# Patient Record
Sex: Female | Born: 1965
Health system: Southern US, Community
[De-identification: ages and names within clinical notes are randomized; demographics above are authoritative.]

## PROBLEM LIST (undated history)

## (undated) DIAGNOSIS — K219 Gastro-esophageal reflux disease without esophagitis: Secondary | ICD-10-CM

## (undated) DIAGNOSIS — I10 Essential (primary) hypertension: Secondary | ICD-10-CM

## (undated) DIAGNOSIS — Z973 Presence of spectacles and contact lenses: Secondary | ICD-10-CM

## (undated) HISTORY — PX: CHOLECYSTECTOMY: SHX55

---

## 1997-05-12 ENCOUNTER — Inpatient Hospital Stay (HOSPITAL_COMMUNITY): Admission: AD | Admit: 1997-05-12 | Discharge: 1997-05-15 | Payer: Self-pay | Admitting: *Deleted

## 1997-06-27 ENCOUNTER — Other Ambulatory Visit: Admission: RE | Admit: 1997-06-27 | Discharge: 1997-06-27 | Payer: Self-pay | Admitting: Obstetrics & Gynecology

## 2004-02-12 ENCOUNTER — Ambulatory Visit: Payer: Self-pay | Admitting: Otolaryngology

## 2005-10-02 ENCOUNTER — Ambulatory Visit: Payer: Self-pay | Admitting: Internal Medicine

## 2006-10-12 ENCOUNTER — Ambulatory Visit: Payer: Self-pay | Admitting: Obstetrics and Gynecology

## 2007-11-25 ENCOUNTER — Ambulatory Visit: Payer: Self-pay | Admitting: Obstetrics and Gynecology

## 2010-08-16 DIAGNOSIS — F17200 Nicotine dependence, unspecified, uncomplicated: Secondary | ICD-10-CM | POA: Insufficient documentation

## 2010-12-16 ENCOUNTER — Ambulatory Visit: Payer: Self-pay | Admitting: Internal Medicine

## 2012-08-03 ENCOUNTER — Ambulatory Visit: Payer: Self-pay | Admitting: Internal Medicine

## 2012-08-25 ENCOUNTER — Ambulatory Visit: Payer: Self-pay | Admitting: Obstetrics and Gynecology

## 2014-08-07 ENCOUNTER — Other Ambulatory Visit: Payer: Self-pay | Admitting: Internal Medicine

## 2014-08-07 DIAGNOSIS — R1310 Dysphagia, unspecified: Secondary | ICD-10-CM

## 2014-08-11 ENCOUNTER — Ambulatory Visit: Payer: Self-pay

## 2014-08-16 ENCOUNTER — Other Ambulatory Visit: Payer: Self-pay | Admitting: Internal Medicine

## 2014-08-16 DIAGNOSIS — S86911A Strain of unspecified muscle(s) and tendon(s) at lower leg level, right leg, initial encounter: Secondary | ICD-10-CM | POA: Insufficient documentation

## 2014-08-16 DIAGNOSIS — Z1231 Encounter for screening mammogram for malignant neoplasm of breast: Secondary | ICD-10-CM

## 2014-08-17 ENCOUNTER — Ambulatory Visit
Admission: RE | Admit: 2014-08-17 | Discharge: 2014-08-17 | Disposition: A | Discharge status: Home / Self Care | Payer: BLUE CROSS/BLUE SHIELD | Source: Ambulatory Visit | Attending: Internal Medicine | Admitting: Internal Medicine

## 2014-08-17 DIAGNOSIS — R1310 Dysphagia, unspecified: Secondary | ICD-10-CM | POA: Diagnosis not present

## 2014-08-17 DIAGNOSIS — K219 Gastro-esophageal reflux disease without esophagitis: Secondary | ICD-10-CM | POA: Insufficient documentation

## 2014-08-17 DIAGNOSIS — K449 Diaphragmatic hernia without obstruction or gangrene: Secondary | ICD-10-CM | POA: Diagnosis not present

## 2014-08-21 ENCOUNTER — Ambulatory Visit
Admission: RE | Admit: 2014-08-21 | Discharge: 2014-08-21 | Disposition: A | Discharge status: Home / Self Care | Payer: BLUE CROSS/BLUE SHIELD | Source: Ambulatory Visit | Attending: Internal Medicine | Admitting: Internal Medicine

## 2014-08-21 DIAGNOSIS — Z1231 Encounter for screening mammogram for malignant neoplasm of breast: Secondary | ICD-10-CM | POA: Diagnosis present

## 2014-08-23 ENCOUNTER — Emergency Department
Admission: EM | Admit: 2014-08-23 | Discharge: 2014-08-23 | Disposition: A | Discharge status: Home / Self Care | Payer: BLUE CROSS/BLUE SHIELD | Attending: Emergency Medicine | Admitting: Emergency Medicine

## 2014-08-23 ENCOUNTER — Other Ambulatory Visit: Payer: Self-pay

## 2014-08-23 ENCOUNTER — Encounter: Payer: Self-pay | Admitting: *Deleted

## 2014-08-23 ENCOUNTER — Emergency Department: Payer: BLUE CROSS/BLUE SHIELD

## 2014-08-23 DIAGNOSIS — I10 Essential (primary) hypertension: Secondary | ICD-10-CM | POA: Diagnosis not present

## 2014-08-23 DIAGNOSIS — R0789 Other chest pain: Secondary | ICD-10-CM | POA: Diagnosis present

## 2014-08-23 DIAGNOSIS — Z720 Tobacco use: Secondary | ICD-10-CM | POA: Diagnosis not present

## 2014-08-23 DIAGNOSIS — R079 Chest pain, unspecified: Secondary | ICD-10-CM

## 2014-08-23 HISTORY — DX: Essential (primary) hypertension: I10

## 2014-08-23 LAB — BASIC METABOLIC PANEL
Anion gap: 9 (ref 5–15)
BUN: 17 mg/dL (ref 6–20)
CALCIUM: 9.6 mg/dL (ref 8.9–10.3)
CHLORIDE: 105 mmol/L (ref 101–111)
CO2: 23 mmol/L (ref 22–32)
Creatinine, Ser: 0.79 mg/dL (ref 0.44–1.00)
GFR calc non Af Amer: >60 mL/min (ref >60)
Glucose, Bld: 128 mg/dL — ABNORMAL HIGH (ref 65–99)
Potassium: 4.1 mmol/L (ref 3.5–5.1)
SODIUM: 137 mmol/L (ref 135–145)

## 2014-08-23 LAB — CBC
HCT: 45.7 % (ref 35.0–47.0)
Hemoglobin: 15.3 g/dL (ref 12.0–16.0)
MCH: 30.7 pg (ref 26.0–34.0)
MCHC: 33.4 g/dL (ref 32.0–36.0)
MCV: 92 fL (ref 80.0–100.0)
Platelets: 165 10*3/uL (ref 150–440)
RBC: 4.96 MIL/uL (ref 3.80–5.20)
RDW: 15 % — AB (ref 11.5–14.5)
WBC: 12.5 10*3/uL — ABNORMAL HIGH (ref 3.6–11.0)

## 2014-08-23 LAB — TROPONIN I

## 2014-08-23 MED ORDER — ASPIRIN 81 MG PO CHEW
CHEWABLE_TABLET | ORAL | Status: AC
  Filled 2014-08-23: qty 4

## 2014-08-23 MED ORDER — ASPIRIN 81 MG PO CHEW
324.0000 mg | CHEWABLE_TABLET | Freq: Once | ORAL | Status: AC
  Administered 2014-08-23: 324 mg via ORAL

## 2014-08-23 NOTE — ED Notes (Signed)
Pt reports onset of cp about 45 minutes ago. The pt states it feels like a pressure in the center of her chest and the pain was radiating to bilateral arms, and up through her jaw behind her ears. Associated with dizziness.

## 2014-08-23 NOTE — ED Notes (Signed)
Pt very anxious and tearful at this time when RN and Careers adviser entered room to start IV. Pt refused IV a this time, MD Quale made aware and verbalized okay to d/c iv at this time and straight stick for repeat troponin. PT made aware of plan of care and verbalized understanding. Pt verbalized no further needs at this time. Pt up walking around room talking on phone while RN in room.

## 2014-08-23 NOTE — Discharge Instructions (Signed)
Chest Pain Observation  Please follow-up with [facility] cardiology in the next 1-2 days. Call in the morning to set up an appointment as they have agreed to see you this week for follow-up.  Turn to the ER right away should her symptoms recur, you have difficulty breathing, sweats, chest pain or tightness, or other new concerns arise.  Please start taking a baby aspirin daily until you have followed up with cardiology.  It is often hard to give a specific diagnosis for the cause of chest pain. Among other possibilities your symptoms might be caused by inadequate oxygen delivery to your heart (angina). Angina that is not treated or evaluated can lead to a heart attack (myocardial infarction) or death. Blood tests, electrocardiograms, and X-rays may have been done to help determine a possible cause of your chest pain. After evaluation and observation, your health care provider has determined that it is unlikely your pain was caused by an unstable condition that requires hospitalization. However, a full evaluation of your pain may need to be completed, with additional diagnostic testing as directed. It is very important to keep your follow-up appointments. Not keeping your follow-up appointments could result in permanent heart damage, disability, or death. If there is any problem keeping your follow-up appointments, you must call your health care provider. HOME CARE INSTRUCTIONS  Due to the slight chance that your pain could be angina, it is important to follow your health care provider's treatment plan and also maintain a healthy lifestyle:  Maintain or work toward achieving a healthy weight.  Stay physically active and exercise regularly.  Decrease your salt intake.  Eat a balanced, healthy diet. Talk to a dietitian to learn about heart-healthy foods.  Increase your fiber intake by including whole grains, vegetables, fruits, and nuts in your diet.  Avoid situations that cause stress, anger, or  depression.  Take medicines as advised by your health care provider. Report any side effects to your health care provider. Do not stop medicines or adjust the dosages on your own.  Quit smoking. Do not use nicotine patches or gum until you check with your health care provider.  Keep your blood pressure, blood sugar, and cholesterol levels within normal limits.  Limit alcohol intake to no more than 1 drink per day for women who are not pregnant and 2 drinks per day for men.  Do not abuse drugs. SEEK IMMEDIATE MEDICAL CARE IF: You have severe chest pain or pressure which may include symptoms such as:  You feel pain or pressure in your arms, neck, jaw, or back.  You have severe back or abdominal pain, feel sick to your stomach (nauseous), or throw up (vomit).  You are sweating profusely.  You are having a fast or irregular heartbeat.  You feel short of breath while at rest.  You notice increasing shortness of breath during rest, sleep, or with activity.  You have chest pain that does not get better after rest or after taking your usual medicine.  You wake from sleep with chest pain.  You are unable to sleep because you cannot breathe.  You develop a frequent cough or you are coughing up blood.  You feel dizzy, faint, or experience extreme fatigue.  You develop severe weakness, dizziness, fainting, or chills. Any of these symptoms may represent a serious problem that is an emergency. Do not wait to see if the symptoms will go away. Call your local emergency services (911 in the [place] Do not drive yourself to the hospital.  MAKE SURE YOU:  Understand these instructions.  Will watch your condition.  Will get help right away if you are not doing well or get worse. Document Released: 04/05/2010 Document Revised: 03/08/2013 Document Reviewed: 09/02/2012 [facility] Patient Information 2015 [place], [place]. This information is not intended to replace advice given to you by your  health care provider. Make sure you discuss any questions you have with your health care provider.

## 2014-08-23 NOTE — ED Notes (Signed)
Pt requesting to use bathroom. Pt ambulated to bathroom with no concerns. Pt verbalized no further needs at this time.

## 2014-08-23 NOTE — ED Provider Notes (Signed)
[facility] Emergency Department Provider Note  ____________________________________________  Time seen: Approximately 4:16 PM  I have reviewed the triage vital signs and the nursing notes.   HISTORY  Chief Complaint Chest Pain    HPI Lynn Small is a 49 y.o. female with a history of hypertension who is also a smoker, who presents today after having an episode of chest pain. Patient states that she was at work at her computer when she began to notice a slight ache across her chest, she said she then began feeling very anxious, and started feeling severe tightness across her chest that radiate into both arms and into her neck. She left the office, went home and the pain resolved. She said and then returned and lasted about 30 minutes, it has gone away now, but this prompted her to come emergency room.  She denies any personal history of any heart disease. She does not have a strong family history of heart disease. She is a smoker. She is been diagnosed with possible hypertension, but is not currently on any medicine. Follows regular with Dr. Doy Hutching.  Denies having any shortness of breath. Presently no pain.   Past Medical History  Diagnosis Date  . Hypertension     There are no active problems to display for this patient.   Past Surgical History  Procedure Laterality Date  . Cholecystectomy      No current outpatient prescriptions on file.  Allergies Review of patient's allergies indicates no known allergies.  No family history on file.  Social History History  Substance Use Topics  . Smoking status: Current Every Day Smoker  . Smokeless tobacco: Not on file  . Alcohol Use: No    Review of Systems Constitutional: No fever/chills Eyes: No visual changes. ENT: No sore throat. Cardiovascular: See history of present illness Respiratory: Denies shortness of breath. Gastrointestinal: No abdominal pain.  No nausea, no vomiting.  No diarrhea.   No constipation. Genitourinary: Negative for dysuria. Musculoskeletal: Negative for back pain. Skin: Negative for rash. Neurological: Negative for headaches, focal weakness or numbness.  She takes no estrogen. She denies any leg swelling. No recent surgeries or trauma. No previous history of any blood clots. No cough.  10-point ROS otherwise negative.  ____________________________________________   PHYSICAL EXAM:  VITAL SIGNS: ED Triage Vitals  Enc Vitals Group     BP 08/23/14 1516 176/93 mmHg     Pulse Rate 08/23/14 1516 87     Resp 08/23/14 1516 16     Temp 08/23/14 1516 97.8 F (36.6 C)     Temp Source 08/23/14 1516 Oral     SpO2 08/23/14 1516 96 %     Weight 08/23/14 1516 200 lb (90.719 kg)     Height 08/23/14 1516 5\' 7"  (1.702 m)     Head Cir --      Peak Flow --      Pain Score 08/23/14 1517 3     Pain Loc --      Pain Edu? --      Excl. in [place]? --     Constitutional: Alert and oriented. Well appearing and in no acute distress. She does appear slightly anxious. Eyes: Conjunctivae are normal. PERRL. EOMI. Head: Atraumatic. Nose: No congestion/rhinnorhea. Mouth/Throat: Mucous membranes are moist.  Oropharynx non-erythematous. Neck: No stridor.   Cardiovascular: Normal rate, regular rhythm. Grossly normal heart sounds.  Good peripheral circulation. Respiratory: Normal respiratory effort.  No retractions. Lungs CTAB. Gastrointestinal: Soft and nontender. No  distention. No abdominal bruits. No CVA tenderness. Musculoskeletal: No lower extremity tenderness nor edema.  No joint effusions. Neurologic:  Normal speech and language. No gross focal neurologic deficits are appreciated. Speech is normal. Normal gait Skin:  Skin is warm, dry and intact. No rash noted. Psychiatric: Mood and affect are normal. Speech and behavior are normal.  Overall, the patient's physical exam appears quite normal at this time. Her blood pressure is noted to be elevated, but she also appears  slightly anxious. ____________________________________________   LABS (all labs ordered are listed, but only abnormal results are displayed)  Labs Reviewed  CBC - Abnormal; Notable for the following:    WBC 12.5 (*)    RDW 15.0 (*)    All other components within normal limits  BASIC METABOLIC PANEL - Abnormal; Notable for the following:    Glucose, Bld 128 (*)    All other components within normal limits  TROPONIN I  TROPONIN I   ____________________________________________  EKG   Date: 08/23/2014  Rate: 90  Rhythm: normal sinus rhythm  QRS Axis: normal  Intervals: normal  ST/T Wave abnormalities: normal  Conduction Disutrbances: none  Narrative Interpretation: unremarkable     ____________________________________________  RADIOLOGY  CLINICAL DATA: Chest pain, bilateral arm pain  EXAM: CHEST 2 VIEW  COMPARISON: None.  FINDINGS: Normal mediastinum and cardiac silhouette. Normal pulmonary vasculature. No evidence of effusion, infiltrate, or pneumothorax. No acute bony abnormality. Postcholecystectomy  IMPRESSION: No acute cardiopulmonary process. ____________________________________________   PROCEDURES  Procedure(s) performed: None  Critical Care performed: No  ____________________________________________   INITIAL IMPRESSION / ASSESSMENT AND PLAN / ED COURSE  Pertinent labs & imaging results that were available during my care of the patient were reviewed by me and considered in my medical decision making (see chart for details).  Patient presents with moderate suspicion chest pain. It is reassuring that her symptoms have gone now. Her EKG shows no ischemic change. Her first troponin is negative. My primary concern given her symptoms would be ruling out acute coronary syndromes. At the present time there is certainly no evidence to suggest acute coronary syndrome. Her heart score is low risk based upon her moderate symptoms, age, normal EKG, and 2  cardiac risk factors including hypertension smoking.  Though the patient's heart score is low risk, I we'll obtain a second troponin 3 hours to further evaluate her chest discomfort. It is reassuring again that she has no chest pain now. We'll monitor her hypertension, she does appear anxious a suspect her blood pressure will come down and she rests in the ER. Her physical exam is very reassuring. She is perc negative.  ----------------------------------------- 4:36 PM on 08/23/2014 -----------------------------------------  Patient remains pain and symptom-free at this time. The patient became very upset when nursing staff attempted to start an IV, therefore it was stopped. At the present time she is quite stable and because she gets very very anxious with IV starts, we will withhold and plan to do a straight stick for labs when her next troponin is due to. ____________________________________________ ----------------------------------------- 7:14 PM on 08/23/2014 -----------------------------------------  Patient is asymptomatic, alert and in no distress. Her blood pressure has indeed improved. I discussed with Dr. Arloa Koh of [facility] cardiology her clinical history, laboratory evaluation EKG. We will have her follow-up in the clinic in the next 1-2 days. The patient is very reliable, closely reviewed return precautions and the plan to follow-up. She is quite agreeable.  FINAL CLINICAL IMPRESSION(S) / ED DIAGNOSES  Final diagnoses:  Chest pain, unspecified chest pain type      Delman Kitten, MD 08/23/14 1931

## 2017-01-22 DIAGNOSIS — J410 Simple chronic bronchitis: Secondary | ICD-10-CM | POA: Diagnosis not present

## 2017-01-22 DIAGNOSIS — F17200 Nicotine dependence, unspecified, uncomplicated: Secondary | ICD-10-CM | POA: Diagnosis not present

## 2017-01-22 DIAGNOSIS — F3341 Major depressive disorder, recurrent, in partial remission: Secondary | ICD-10-CM | POA: Diagnosis not present

## 2017-01-22 DIAGNOSIS — I10 Essential (primary) hypertension: Secondary | ICD-10-CM | POA: Diagnosis not present

## 2017-01-22 DIAGNOSIS — K219 Gastro-esophageal reflux disease without esophagitis: Secondary | ICD-10-CM | POA: Diagnosis not present

## 2017-01-29 ENCOUNTER — Other Ambulatory Visit: Payer: Self-pay | Admitting: Internal Medicine

## 2017-01-29 DIAGNOSIS — E559 Vitamin D deficiency, unspecified: Secondary | ICD-10-CM | POA: Diagnosis not present

## 2017-01-29 DIAGNOSIS — Z79899 Other long term (current) drug therapy: Secondary | ICD-10-CM | POA: Diagnosis not present

## 2017-01-29 DIAGNOSIS — E782 Mixed hyperlipidemia: Secondary | ICD-10-CM | POA: Diagnosis not present

## 2017-01-29 DIAGNOSIS — Z1231 Encounter for screening mammogram for malignant neoplasm of breast: Secondary | ICD-10-CM

## 2017-01-29 DIAGNOSIS — I10 Essential (primary) hypertension: Secondary | ICD-10-CM | POA: Diagnosis not present

## 2017-02-20 ENCOUNTER — Ambulatory Visit
Admission: RE | Admit: 2017-02-20 | Discharge: 2017-02-20 | Disposition: A | Discharge status: Home / Self Care | Payer: BLUE CROSS/BLUE SHIELD | Source: Ambulatory Visit | Attending: Internal Medicine | Admitting: Internal Medicine

## 2017-02-20 DIAGNOSIS — Z1231 Encounter for screening mammogram for malignant neoplasm of breast: Secondary | ICD-10-CM | POA: Diagnosis not present

## 2017-03-16 DIAGNOSIS — S46812A Strain of other muscles, fascia and tendons at shoulder and upper arm level, left arm, initial encounter: Secondary | ICD-10-CM | POA: Insufficient documentation

## 2017-03-16 DIAGNOSIS — M25512 Pain in left shoulder: Secondary | ICD-10-CM | POA: Diagnosis not present

## 2017-03-20 ENCOUNTER — Telehealth: Payer: Self-pay | Admitting: Gastroenterology

## 2017-03-20 ENCOUNTER — Other Ambulatory Visit: Payer: Self-pay

## 2017-03-20 DIAGNOSIS — Z1211 Encounter for screening for malignant neoplasm of colon: Secondary | ICD-10-CM

## 2017-03-20 NOTE — Telephone Encounter (Signed)
Gastroenterology Pre-Procedure Review  Request Date:   Requesting Physician: Dr.    PATIENT REVIEW QUESTIONS: The patient responded to the following health history questions as indicated:    1. Are you having any GI issues? no 2. Do you have a personal history of Polyps? no 3. Do you have a family history of Colon Cancer or Polyps? no 4. Diabetes Mellitus? no 5. Joint replacements in the past 12 months?no 6. Major health problems in the past 3 months?no 7. Any artificial heart valves, MVP, or defibrillator?no    MEDICATIONS & ALLERGIES:    Patient reports the following regarding taking any anticoagulation/antiplatelet therapy:   Plavix, Coumadin, Eliquis, Xarelto, Lovenox, Pradaxa, Brilinta, or Effient? no Aspirin? yes (ASA 81 mg)  Patient confirms/reports the following medications:  No current outpatient medications on file.   No current facility-administered medications for this visit.     Patient confirms/reports the following allergies:  No Known Allergies  No orders of the defined types were placed in this encounter.   AUTHORIZATION INFORMATION Primary Insurance: 1D#: Group #:  Secondary Insurance: 1D#: Group #:  SCHEDULE INFORMATION: Date:  Time: Location:

## 2017-03-30 ENCOUNTER — Encounter: Payer: Self-pay | Admitting: *Deleted

## 2017-03-30 ENCOUNTER — Other Ambulatory Visit: Payer: Self-pay

## 2017-04-02 DIAGNOSIS — Z01419 Encounter for gynecological examination (general) (routine) without abnormal findings: Secondary | ICD-10-CM | POA: Diagnosis not present

## 2017-04-03 NOTE — Discharge Instructions (Signed)
General Anesthesia, Adult, Care After °These instructions provide you with information about caring for yourself after your procedure. Your health care provider may also give you more specific instructions. Your treatment has been planned according to current medical practices, but problems sometimes occur. Call your health care provider if you have any problems or questions after your procedure. °What can I expect after the procedure? °After the procedure, it is common to have: °· Vomiting. °· A sore throat. °· Mental slowness. ° °It is common to feel: °· Nauseous. °· Cold or shivery. °· Sleepy. °· Tired. °· Sore or achy, even in parts of your body where you did not have surgery. ° °Follow these instructions at home: °For at least 24 hours after the procedure: °· Do not: °? Participate in activities where you could fall or become injured. °? Drive. °? Use heavy machinery. °? Drink alcohol. °? Take sleeping pills or medicines that cause drowsiness. °? Make important decisions or sign legal documents. °? Take care of children on your own. °· Rest. °Eating and drinking °· If you vomit, drink water, juice, or soup when you can drink without vomiting. °· Drink enough fluid to keep your urine clear or pale yellow. °· Make sure you have little or no nausea before eating solid foods. °· Follow the diet recommended by your health care provider. °General instructions °· Have a responsible adult stay with you until you are awake and alert. °· Return to your normal activities as told by your health care provider. Ask your health care provider what activities are safe for you. °· Take over-the-counter and prescription medicines only as told by your health care provider. °· If you smoke, do not smoke without supervision. °· Keep all follow-up visits as told by your health care provider. This is important. °Contact a health care provider if: °· You continue to have nausea or vomiting at home, and medicines are not helpful. °· You  cannot drink fluids or start eating again. °· You cannot urinate after 8-12 hours. °· You develop a skin rash. °· You have fever. °· You have increasing redness at the site of your procedure. °Get help right away if: °· You have difficulty breathing. °· You have chest pain. °· You have unexpected bleeding. °· You feel that you are having a life-threatening or urgent problem. °This information is not intended to replace advice given to you by your health care provider. Make sure you discuss any questions you have with your health care provider. °Document Released: 06/09/2000 Document Revised: 08/06/2015 Document Reviewed: 02/15/2015 °Elsevier Interactive Patient Education © 2018 [employer] ° °

## 2017-04-06 ENCOUNTER — Ambulatory Visit: Payer: BLUE CROSS/BLUE SHIELD | Admitting: Anesthesiology

## 2017-04-06 ENCOUNTER — Ambulatory Visit
Admission: RE | Admit: 2017-04-06 | Discharge: 2017-04-06 | Disposition: A | Discharge status: Home / Self Care | Payer: BLUE CROSS/BLUE SHIELD | Source: Ambulatory Visit | Attending: Gastroenterology | Admitting: Gastroenterology

## 2017-04-06 ENCOUNTER — Encounter
Admission: RE | Disposition: A | Discharge status: Home / Self Care | Payer: Self-pay | Source: Ambulatory Visit | Attending: Gastroenterology

## 2017-04-06 DIAGNOSIS — K641 Second degree hemorrhoids: Secondary | ICD-10-CM | POA: Diagnosis not present

## 2017-04-06 DIAGNOSIS — K219 Gastro-esophageal reflux disease without esophagitis: Secondary | ICD-10-CM | POA: Diagnosis not present

## 2017-04-06 DIAGNOSIS — Z87891 Personal history of nicotine dependence: Secondary | ICD-10-CM | POA: Insufficient documentation

## 2017-04-06 DIAGNOSIS — I10 Essential (primary) hypertension: Secondary | ICD-10-CM | POA: Diagnosis not present

## 2017-04-06 DIAGNOSIS — Z1211 Encounter for screening for malignant neoplasm of colon: Secondary | ICD-10-CM | POA: Diagnosis not present

## 2017-04-06 DIAGNOSIS — Z7982 Long term (current) use of aspirin: Secondary | ICD-10-CM | POA: Diagnosis not present

## 2017-04-06 DIAGNOSIS — D120 Benign neoplasm of cecum: Secondary | ICD-10-CM

## 2017-04-06 DIAGNOSIS — Z79899 Other long term (current) drug therapy: Secondary | ICD-10-CM | POA: Insufficient documentation

## 2017-04-06 DIAGNOSIS — D125 Benign neoplasm of sigmoid colon: Secondary | ICD-10-CM | POA: Diagnosis not present

## 2017-04-06 DIAGNOSIS — K635 Polyp of colon: Secondary | ICD-10-CM | POA: Insufficient documentation

## 2017-04-06 HISTORY — DX: Gastro-esophageal reflux disease without esophagitis: K21.9

## 2017-04-06 HISTORY — PX: COLONOSCOPY WITH PROPOFOL: SHX5780

## 2017-04-06 HISTORY — DX: Presence of spectacles and contact lenses: Z97.3

## 2017-04-06 HISTORY — PX: POLYPECTOMY: SHX149

## 2017-04-06 SURGERY — COLONOSCOPY WITH PROPOFOL
Anesthesia: General | Wound class: Contaminated

## 2017-04-06 MED ORDER — SODIUM CHLORIDE 0.9 % IV SOLN: INTRAVENOUS | Status: DC

## 2017-04-06 MED ORDER — LIDOCAINE HCL (CARDIAC) 20 MG/ML IV SOLN
INTRAVENOUS | Status: DC | PRN
  Administered 2017-04-06: 50 mg via INTRAVENOUS

## 2017-04-06 MED ORDER — LACTATED RINGERS IV SOLN
INTRAVENOUS | Status: DC
  Administered 2017-04-06: 08:00:00 via INTRAVENOUS

## 2017-04-06 MED ORDER — PROPOFOL 10 MG/ML IV BOLUS
INTRAVENOUS | Status: DC | PRN
  Administered 2017-04-06: 80 mg via INTRAVENOUS
  Administered 2017-04-06: 10 mg via INTRAVENOUS
  Administered 2017-04-06 (×4): 20 mg via INTRAVENOUS
  Administered 2017-04-06: 30 mg via INTRAVENOUS
  Administered 2017-04-06: 20 mg via INTRAVENOUS

## 2017-04-06 SURGICAL SUPPLY — 24 items
CANISTER SUCT 1200ML W/VALVE (MISCELLANEOUS) ×3 IMPLANT
CLIP HMST 235XBRD CATH ROT (MISCELLANEOUS) IMPLANT
CLIP RESOLUTION 360 11X235 (MISCELLANEOUS)
ELECT REM PT RETURN 9FT ADLT (ELECTROSURGICAL)
ELECTRODE REM PT RTRN 9FT ADLT (ELECTROSURGICAL) IMPLANT
FCP ESCP3.2XJMB 240X2.8X (MISCELLANEOUS)
FORCEPS BIOP RAD 4 LRG CAP 4 (CUTTING FORCEPS) ×1 IMPLANT
FORCEPS BIOP RJ4 240 W/NDL (MISCELLANEOUS)
FORCEPS ESCP3.2XJMB 240X2.8X (MISCELLANEOUS) IMPLANT
GOWN CVR UNV OPN BCK APRN NK (MISCELLANEOUS) ×4 IMPLANT
GOWN ISOL THUMB LOOP REG UNIV (MISCELLANEOUS) ×6
INJECTOR VARIJECT VIN23 (MISCELLANEOUS) IMPLANT
KIT DEFENDO VALVE AND CONN (KITS) IMPLANT
KIT ENDO PROCEDURE OLY (KITS) ×3 IMPLANT
MARKER SPOT ENDO TATTOO 5ML (MISCELLANEOUS) IMPLANT
PROBE APC STR FIRE (PROBE) IMPLANT
RETRIEVER NET ROTH 2.5X230 LF (MISCELLANEOUS) IMPLANT
SNARE SHORT THROW 13M SML OVAL (MISCELLANEOUS) IMPLANT
SNARE SHORT THROW 30M LRG OVAL (MISCELLANEOUS) IMPLANT
SNARE SNG USE RND 15MM (INSTRUMENTS) IMPLANT
SPOT EX ENDOSCOPIC TATTOO (MISCELLANEOUS)
TRAP ETRAP POLY (MISCELLANEOUS) IMPLANT
VARIJECT INJECTOR VIN23 (MISCELLANEOUS)
WATER STERILE IRR 250ML POUR (IV SOLUTION) ×3 IMPLANT

## 2017-04-06 NOTE — Op Note (Signed)
[facility] Gastroenterology Patient Name: Loida Calamia Procedure Date: 04/06/2017 9:22 AM MRN: [id number] Account #: [id number] Date of Birth: 1965-03-25 Admit Type: Outpatient Age: 52 Room: [facility] OR ROOM 01 Gender: Female Note Status: Finalized Procedure:            Colonoscopy Indications:          Screening for colorectal malignant neoplasm Providers:            Lucilla Lame MD, MD Referring MD:         Leonie Douglas. Doy Hutching, MD (Referring MD) Medicines:            Propofol per Anesthesia Complications:        No immediate complications. Procedure:            Pre-Anesthesia Assessment:                       - Prior to the procedure, a History and Physical was                        performed, and patient medications and allergies were                        reviewed. The patient's tolerance of previous                        anesthesia was also reviewed. The risks and benefits of                        the procedure and the sedation options and risks were                        discussed with the patient. All questions were                        answered, and informed consent was obtained. Prior                        Anticoagulants: The patient has taken no previous                        anticoagulant or antiplatelet agents. ASA Grade                        Assessment: II - A patient with mild systemic disease.                        After reviewing the risks and benefits, the patient was                        deemed in satisfactory condition to undergo the                        procedure.                       After obtaining informed consent, the colonoscope was                        passed under direct vision. Throughout the procedure,  the patient's blood pressure, pulse, and oxygen                        saturations were monitored continuously. The Olympus                        Colonoscope [id number]) was introduced through the                         anus and advanced to the the cecum, identified by                        appendiceal orifice and ileocecal valve. The                        colonoscopy was performed without difficulty. The                        patient tolerated the procedure well. The quality of                        the bowel preparation was excellent. Findings:      The perianal and digital rectal examinations were normal.      A 2 mm polyp was found in the cecum. The polyp was sessile. The polyp       was removed with a cold biopsy forceps. Resection and retrieval were       complete.      Two sessile polyps were found in the sigmoid colon. The polyps were 2 to       3 mm in size. These polyps were removed with a cold biopsy forceps.       Resection and retrieval were complete.      Non-bleeding internal hemorrhoids were found during retroflexion. The       hemorrhoids were Grade II (internal hemorrhoids that prolapse but reduce       spontaneously). Impression:           - One 2 mm polyp in the cecum, removed with a cold                        biopsy forceps. Resected and retrieved.                       - Two 2 to 3 mm polyps in the sigmoid colon, removed                        with a cold biopsy forceps. Resected and retrieved.                       - Non-bleeding internal hemorrhoids. Recommendation:       - Discharge patient to home.                       - Resume previous diet.                       - Continue present medications.                       - Await pathology results.                       -  Repeat colonoscopy in 5 years if polyp adenoma and 10                        years if hyperplastic Procedure Code(s):    --- Professional ---                       [phone], Colonoscopy, flexible; with biopsy, single or                        multiple Diagnosis Code(s):    --- Professional ---                       Z12.11, Encounter for screening for malignant neoplasm                         of colon                       D12.0, Benign neoplasm of cecum                       D12.5, Benign neoplasm of sigmoid colon CPT copyright 2016 [employer]. All rights reserved. The codes documented in this report are preliminary and upon coder review may  be revised to meet current compliance requirements. Lucilla Lame MD, MD 04/06/2017 9:41:03 AM This report has been signed electronically. Number of Addenda: 0 Note Initiated On: 04/06/2017 9:22 AM Scope Withdrawal Time: 0 hours 7 minutes 25 seconds  Total Procedure Duration: 0 hours 9 minutes 50 seconds       [facility]

## 2017-04-06 NOTE — H&P (Signed)
Lucilla Lame, MD [address] [place], [place] [address] Phone: [phone] Fax : [phone]  Primary Care Physician:  Idelle Crouch, MD Primary Gastroenterologist:  Dr. Allen Norris  Pre-Procedure History & Physical: HPI:  Lynn Small is a 52 y.o. female is here for a screening colonoscopy.   Past Medical History:  Diagnosis Date  . GERD (gastroesophageal reflux disease)   . Hypertension   . Wears contact lenses     Past Surgical History:  Procedure Laterality Date  . CHOLECYSTECTOMY      Prior to Admission medications   Medication Sig Start Date End Date Taking? Authorizing Provider  ASHWAGANDHA PO Take by mouth daily.   Yes [provider]  aspirin 81 MG tablet Take 81 mg by mouth daily.   Yes [provider]  B Complex Vitamins (B COMPLEX PO) Take by mouth daily.   Yes [provider]  CALCIUM CARB-CHOLECALCIFEROL PO Take by mouth daily.   Yes [provider]  Cholecalciferol (VITAMIN D PO) Take by mouth daily.   Yes [provider]  GLUCOSAMINE-CHONDROITIN PO Take by mouth daily.   Yes [provider]  MILK THISTLE PO Take by mouth daily.   Yes [provider]  Multiple Vitamin (MULTIVITAMIN) tablet Take 1 tablet by mouth daily.   Yes [provider]  NON FORMULARY daily. Tru Niagen   Yes [provider]  Omega-3 Fatty Acids (FISH OIL PO) Take by mouth daily.   Yes [provider]  Probiotic Product (PROBIOTIC PO) Take by mouth daily.   Yes [provider]  RABEprazole (ACIPHEX) 20 MG tablet Take 20 mg by mouth 2 (two) times daily.   Yes [provider]  Rosuvastatin Calcium (CRESTOR PO) Take by mouth daily.   Yes [provider]  TURMERIC PO Take by mouth daily.   Yes [provider]    Allergies as of 03/20/2017  . (No Known Allergies)    History reviewed. No pertinent family history.  Social History   Socioeconomic  History  . Marital status: Married    Spouse name: Not on file  . Number of children: Not on file  . Years of education: Not on file  . Highest education level: Not on file  Social Needs  . Financial resource strain: Not on file  . Food insecurity - worry: Not on file  . Food insecurity - inability: Not on file  . Transportation needs - medical: Not on file  . Transportation needs - non-medical: Not on file  Occupational History  . Not on file  Tobacco Use  . Smoking status: Former Smoker    Last attempt to quit: 10/14/2016    Years since quitting: 0.4  . Smokeless tobacco: Never Used  Substance and Sexual Activity  . Alcohol use: Yes    Alcohol/week: 8.4 oz    Types: 14 Glasses of wine per week  . Drug use: Not on file  . Sexual activity: Not on file  Other Topics Concern  . Not on file  Social History Narrative  . Not on file    Review of Systems: See HPI, otherwise negative ROS  Physical Exam: Ht 5\' 7"  (1.702 m)   Wt 200 lb (90.7 kg)   BMI 31.32 kg/m  General:   Alert,  pleasant and cooperative in NAD Head:  Normocephalic and atraumatic. Neck:  Supple; no masses or thyromegaly. Lungs:  Clear throughout to auscultation.    Heart:  Regular rate  and rhythm. Abdomen:  Soft, nontender and nondistended. Normal bowel sounds, without guarding, and without rebound.   Neurologic:  Alert and  oriented x4;  grossly normal neurologically.  Impression/Plan: Lynn Small is now here to undergo a screening colonoscopy.  Risks, benefits, and alternatives regarding colonoscopy have been reviewed with the patient.  Questions have been answered.  All parties agreeable.

## 2017-04-06 NOTE — Anesthesia Postprocedure Evaluation (Signed)
Anesthesia Post Note  Patient: Lynn Small  Procedure(s) Performed: COLONOSCOPY WITH PROPOFOL (N/A ) POLYPECTOMY INTESTINAL  Patient location during evaluation: PACU Anesthesia Type: General Level of consciousness: awake and alert Pain management: pain level controlled Vital Signs Assessment: post-procedure vital signs reviewed and stable Respiratory status: spontaneous breathing Cardiovascular status: blood pressure returned to baseline Postop Assessment: no headache Anesthetic complications: no    Jaci Standard, III,  Aleiya Rye D

## 2017-04-06 NOTE — Anesthesia Preprocedure Evaluation (Addendum)
Anesthesia Evaluation  Patient identified by MRN, date of birth, ID band Patient awake    Reviewed: Allergy & Precautions, H&P , NPO status , Patient's Chart, lab work & pertinent test results  History of Anesthesia Complications Negative for: history of anesthetic complications  Airway Mallampati: II  TM Distance: >3 FB Neck ROM: full    Dental no notable dental hx.    Pulmonary former smoker,    Pulmonary exam normal        Cardiovascular hypertension, On Medications Normal cardiovascular exam     Neuro/Psych    GI/Hepatic Neg liver ROS, Medicated,  Endo/Other  negative endocrine ROS  Renal/GU negative Renal ROS     Musculoskeletal   Abdominal   Peds  Hematology negative hematology ROS (+)   Anesthesia Other Findings   Reproductive/Obstetrics                            Anesthesia Physical Anesthesia Plan  ASA: II  Anesthesia Plan: General   Post-op Pain Management:    Induction:   PONV Risk Score and Plan:   Airway Management Planned:   Additional Equipment:   Intra-op Plan:   Post-operative Plan:   Informed Consent: I have reviewed the patients History and Physical, chart, labs and discussed the procedure including the risks, benefits and alternatives for the proposed anesthesia with the patient or authorized representative who has indicated his/her understanding and acceptance.     Plan Discussed with:   Anesthesia Plan Comments:         Anesthesia Quick Evaluation

## 2017-04-06 NOTE — Transfer of Care (Signed)
Immediate Anesthesia Transfer of Care Note  Patient: Lynn Small  Procedure(s) Performed: COLONOSCOPY WITH PROPOFOL (N/A ) POLYPECTOMY INTESTINAL  Patient Location: PACU  Anesthesia Type: General  Level of Consciousness: awake, alert  and patient cooperative  Airway and Oxygen Therapy: Patient Spontanous Breathing and Patient connected to supplemental oxygen  Post-op Assessment: Post-op Vital signs reviewed, Patient's Cardiovascular Status Stable, Respiratory Function Stable, Patent Airway and No signs of Nausea or vomiting  Post-op Vital Signs: Reviewed and stable  Complications: No apparent anesthesia complications

## 2017-04-06 NOTE — Anesthesia Procedure Notes (Signed)
Performed by: Roosvelt Churchwell, CRNA Pre-anesthesia Checklist: Patient identified, Emergency Drugs available, Suction available, Timeout performed and Patient being monitored Patient Re-evaluated:Patient Re-evaluated prior to induction Oxygen Delivery Method: Nasal cannula Placement Confirmation: positive ETCO2       

## 2017-04-08 ENCOUNTER — Encounter: Payer: Self-pay | Admitting: Gastroenterology

## 2017-04-09 DIAGNOSIS — M25512 Pain in left shoulder: Secondary | ICD-10-CM | POA: Diagnosis not present

## 2017-04-20 ENCOUNTER — Encounter: Payer: Self-pay | Admitting: Gastroenterology

## 2017-05-18 DIAGNOSIS — Z79899 Other long term (current) drug therapy: Secondary | ICD-10-CM | POA: Diagnosis not present

## 2017-05-18 DIAGNOSIS — H25013 Cortical age-related cataract, bilateral: Secondary | ICD-10-CM | POA: Diagnosis not present

## 2017-05-18 DIAGNOSIS — H40013 Open angle with borderline findings, low risk, bilateral: Secondary | ICD-10-CM | POA: Diagnosis not present

## 2017-05-18 DIAGNOSIS — H2513 Age-related nuclear cataract, bilateral: Secondary | ICD-10-CM | POA: Diagnosis not present

## 2017-05-18 DIAGNOSIS — H2512 Age-related nuclear cataract, left eye: Secondary | ICD-10-CM | POA: Diagnosis not present

## 2017-05-18 DIAGNOSIS — E782 Mixed hyperlipidemia: Secondary | ICD-10-CM | POA: Diagnosis not present

## 2017-05-18 DIAGNOSIS — H25043 Posterior subcapsular polar age-related cataract, bilateral: Secondary | ICD-10-CM | POA: Diagnosis not present

## 2017-05-26 DIAGNOSIS — H2589 Other age-related cataract: Secondary | ICD-10-CM | POA: Diagnosis not present

## 2017-05-26 DIAGNOSIS — H2512 Age-related nuclear cataract, left eye: Secondary | ICD-10-CM | POA: Diagnosis not present

## 2017-07-22 DIAGNOSIS — Z79899 Other long term (current) drug therapy: Secondary | ICD-10-CM | POA: Diagnosis not present

## 2017-07-22 DIAGNOSIS — I10 Essential (primary) hypertension: Secondary | ICD-10-CM | POA: Diagnosis not present

## 2017-07-22 DIAGNOSIS — F3341 Major depressive disorder, recurrent, in partial remission: Secondary | ICD-10-CM | POA: Diagnosis not present

## 2017-07-22 DIAGNOSIS — Z0000 Encounter for general adult medical examination without abnormal findings: Secondary | ICD-10-CM | POA: Diagnosis not present

## 2017-08-04 DIAGNOSIS — R6889 Other general symptoms and signs: Secondary | ICD-10-CM | POA: Diagnosis not present

## 2017-08-04 DIAGNOSIS — R1031 Right lower quadrant pain: Secondary | ICD-10-CM | POA: Diagnosis not present

## 2017-08-04 DIAGNOSIS — R5383 Other fatigue: Secondary | ICD-10-CM | POA: Diagnosis not present

## 2017-08-04 DIAGNOSIS — R1032 Left lower quadrant pain: Secondary | ICD-10-CM | POA: Diagnosis not present

## 2017-08-06 DIAGNOSIS — D125 Benign neoplasm of sigmoid colon: Secondary | ICD-10-CM | POA: Diagnosis not present

## 2017-08-06 DIAGNOSIS — S80862A Insect bite (nonvenomous), left lower leg, initial encounter: Secondary | ICD-10-CM | POA: Diagnosis not present

## 2017-08-06 DIAGNOSIS — T63304A Toxic effect of unspecified spider venom, undetermined, initial encounter: Secondary | ICD-10-CM | POA: Diagnosis not present

## 2017-08-06 DIAGNOSIS — D649 Anemia, unspecified: Secondary | ICD-10-CM | POA: Diagnosis not present

## 2017-08-07 ENCOUNTER — Emergency Department: Payer: BLUE CROSS/BLUE SHIELD

## 2017-08-07 ENCOUNTER — Encounter: Payer: Self-pay | Admitting: Emergency Medicine

## 2017-08-07 ENCOUNTER — Emergency Department
Admission: EM | Admit: 2017-08-07 | Discharge: 2017-08-07 | Disposition: A | Discharge status: Home / Self Care | Payer: BLUE CROSS/BLUE SHIELD | Attending: Emergency Medicine | Admitting: Emergency Medicine

## 2017-08-07 ENCOUNTER — Other Ambulatory Visit: Payer: Self-pay

## 2017-08-07 DIAGNOSIS — Z7982 Long term (current) use of aspirin: Secondary | ICD-10-CM | POA: Insufficient documentation

## 2017-08-07 DIAGNOSIS — R509 Fever, unspecified: Secondary | ICD-10-CM | POA: Diagnosis not present

## 2017-08-07 DIAGNOSIS — J918 Pleural effusion in other conditions classified elsewhere: Secondary | ICD-10-CM | POA: Diagnosis not present

## 2017-08-07 DIAGNOSIS — Z87891 Personal history of nicotine dependence: Secondary | ICD-10-CM | POA: Insufficient documentation

## 2017-08-07 DIAGNOSIS — R0789 Other chest pain: Secondary | ICD-10-CM | POA: Diagnosis not present

## 2017-08-07 DIAGNOSIS — I959 Hypotension, unspecified: Secondary | ICD-10-CM | POA: Diagnosis not present

## 2017-08-07 DIAGNOSIS — Z79899 Other long term (current) drug therapy: Secondary | ICD-10-CM | POA: Insufficient documentation

## 2017-08-07 DIAGNOSIS — I10 Essential (primary) hypertension: Secondary | ICD-10-CM | POA: Insufficient documentation

## 2017-08-07 DIAGNOSIS — K5900 Constipation, unspecified: Secondary | ICD-10-CM | POA: Insufficient documentation

## 2017-08-07 DIAGNOSIS — R638 Other symptoms and signs concerning food and fluid intake: Secondary | ICD-10-CM | POA: Insufficient documentation

## 2017-08-07 DIAGNOSIS — R109 Unspecified abdominal pain: Secondary | ICD-10-CM | POA: Diagnosis not present

## 2017-08-07 DIAGNOSIS — R110 Nausea: Secondary | ICD-10-CM | POA: Insufficient documentation

## 2017-08-07 DIAGNOSIS — R51 Headache: Secondary | ICD-10-CM | POA: Insufficient documentation

## 2017-08-07 DIAGNOSIS — E876 Hypokalemia: Secondary | ICD-10-CM | POA: Diagnosis not present

## 2017-08-07 DIAGNOSIS — J189 Pneumonia, unspecified organism: Secondary | ICD-10-CM

## 2017-08-07 DIAGNOSIS — R52 Pain, unspecified: Secondary | ICD-10-CM | POA: Diagnosis not present

## 2017-08-07 DIAGNOSIS — J181 Lobar pneumonia, unspecified organism: Secondary | ICD-10-CM | POA: Insufficient documentation

## 2017-08-07 LAB — BASIC METABOLIC PANEL
Anion gap: 10 (ref 5–15)
BUN: 9 mg/dL (ref 6–20)
CO2: 28 mmol/L (ref 22–32)
CREATININE: 0.77 mg/dL (ref 0.44–1.00)
Calcium: 9.3 mg/dL (ref 8.9–10.3)
Chloride: 95 mmol/L — ABNORMAL LOW (ref 101–111)
GFR calc non Af Amer: >60 mL/min (ref >60)
GLUCOSE: 105 mg/dL — AB (ref 65–99)
Potassium: 3 mmol/L — ABNORMAL LOW (ref 3.5–5.1)
Sodium: 133 mmol/L — ABNORMAL LOW (ref 135–145)

## 2017-08-07 LAB — URINALYSIS, COMPLETE (UACMP) WITH MICROSCOPIC
BILIRUBIN URINE: NEGATIVE
Bacteria, UA: NONE SEEN
GLUCOSE, UA: NEGATIVE mg/dL
KETONES UR: 20 mg/dL — AB
Nitrite: NEGATIVE
PH: 5 (ref 5.0–8.0)
Protein, ur: 30 mg/dL — AB
Specific Gravity, Urine: 1.015 (ref 1.005–1.030)

## 2017-08-07 LAB — HEPATIC FUNCTION PANEL
ALK PHOS: 116 U/L (ref 38–126)
ALT: 40 U/L (ref 14–54)
AST: 50 U/L — ABNORMAL HIGH (ref 15–41)
Albumin: 3.8 g/dL (ref 3.5–5.0)
BILIRUBIN DIRECT: 0.3 mg/dL (ref 0.1–0.5)
BILIRUBIN INDIRECT: 0.7 mg/dL (ref 0.3–0.9)
BILIRUBIN TOTAL: 1 mg/dL (ref 0.3–1.2)
Total Protein: 8 g/dL (ref 6.5–8.1)

## 2017-08-07 LAB — LIPASE, BLOOD: LIPASE: 27 U/L (ref 11–51)

## 2017-08-07 LAB — CBC
HCT: 33.8 % — ABNORMAL LOW (ref 35.0–47.0)
Hemoglobin: 11.9 g/dL — ABNORMAL LOW (ref 12.0–16.0)
MCH: 30.3 pg (ref 26.0–34.0)
MCHC: 35.4 g/dL (ref 32.0–36.0)
MCV: 85.7 fL (ref 80.0–100.0)
PLATELETS: 167 10*3/uL (ref 150–440)
RBC: 3.94 MIL/uL (ref 3.80–5.20)
RDW: 14.1 % (ref 11.5–14.5)
WBC: 6.9 10*3/uL (ref 3.6–11.0)

## 2017-08-07 LAB — MONONUCLEOSIS SCREEN: MONO SCREEN: NEGATIVE

## 2017-08-07 MED ORDER — PREDNISONE 10 MG PO TABS: 50.0000 mg | ORAL_TABLET | Freq: Every day | ORAL | 0 refills | Status: DC

## 2017-08-07 MED ORDER — SODIUM CHLORIDE 0.9 % IV BOLUS
1000.0000 mL | Freq: Once | INTRAVENOUS | Status: AC
  Administered 2017-08-07: 1000 mL via INTRAVENOUS

## 2017-08-07 MED ORDER — MORPHINE SULFATE (PF) 4 MG/ML IV SOLN
4.0000 mg | Freq: Once | INTRAVENOUS | Status: AC
  Administered 2017-08-07: 4 mg via INTRAVENOUS
  Filled 2017-08-07: qty 1

## 2017-08-07 MED ORDER — IOPAMIDOL (ISOVUE-370) INJECTION 76%
75.0000 mL | Freq: Once | INTRAVENOUS | Status: AC | PRN
  Administered 2017-08-07: 75 mL via INTRAVENOUS
  Filled 2017-08-07: qty 75

## 2017-08-07 MED ORDER — IBUPROFEN 600 MG PO TABS
ORAL_TABLET | ORAL | Status: AC
  Administered 2017-08-07: 600 mg via ORAL
  Filled 2017-08-07: qty 1

## 2017-08-07 MED ORDER — ACETAMINOPHEN 500 MG PO TABS
ORAL_TABLET | ORAL | Status: AC
  Administered 2017-08-07: 1000 mg via ORAL
  Filled 2017-08-07: qty 2

## 2017-08-07 MED ORDER — ACETAMINOPHEN 500 MG PO TABS
1000.0000 mg | ORAL_TABLET | Freq: Once | ORAL | Status: AC
  Administered 2017-08-07: 1000 mg via ORAL

## 2017-08-07 MED ORDER — SODIUM CHLORIDE 0.9 % IV SOLN
1.0000 g | Freq: Once | INTRAVENOUS | Status: AC
  Administered 2017-08-07: 1 g via INTRAVENOUS
  Filled 2017-08-07: qty 10

## 2017-08-07 MED ORDER — LEVOFLOXACIN 750 MG PO TABS: 750.0000 mg | ORAL_TABLET | Freq: Every day | ORAL | 0 refills | Status: AC

## 2017-08-07 MED ORDER — ONDANSETRON HCL 4 MG/2ML IJ SOLN
4.0000 mg | Freq: Once | INTRAMUSCULAR | Status: AC
  Administered 2017-08-07: 4 mg via INTRAVENOUS
  Filled 2017-08-07: qty 2

## 2017-08-07 MED ORDER — IBUPROFEN 600 MG PO TABS
600.0000 mg | ORAL_TABLET | Freq: Once | ORAL | Status: AC
  Administered 2017-08-07: 600 mg via ORAL

## 2017-08-07 NOTE — ED Triage Notes (Signed)
First Nurse Note: Arrives from [facility] for further evaluation of headache, low greade fever, and neck tenderness.   Patient is AAOx3.  Skin warm and dry. NAD

## 2017-08-07 NOTE — ED Notes (Signed)
See triage note  Presents with fever and headache  States she had a spider bite about 10 days ago  Then developed fever and body aches  Was seen by PCP and had lab work drawn  ..presents today with fever and h/a  States headache is at both temporal areas  Had been taking ibu for sx's but was told to change to tylenol    Positive nausea and decreased appetite  No vomiting or diarrhea  Tearful on arrival

## 2017-08-07 NOTE — ED Triage Notes (Signed)
Pt to ED via POV c/o headache, fever, lower back pain, dizziness x 5 days. Pt states that she feels like her symptoms are getting worse. Pt is in NAD at this time. Pt denies vomiting or diarrhea. Pt does have nausea and decreased appetite.

## 2017-08-07 NOTE — ED Provider Notes (Signed)
[facility] Emergency Department Provider Note  ____________________________________________   None    (approximate)  I have reviewed the triage vital signs and the nursing notes.   HISTORY  Chief Complaint Headache and Dizziness    HPI NEVILLE PAULS is a 52 y.o. female presents emergency department complaining of fever and headache.  She states she had a spider bite about 10 days ago and wondered if this was it.  She denies any known tick bites.  However she does work in the yard.  She was seen by her regular doctor several days this week.  He started her on doxycycline yesterday in hopes that the Doxy would take care of [facility] spotted fever if this was the problem.  However he did not draw labs.  She has had nausea and a decreased appetite.  She states her blood pressure is lower than it is normally in the PAs over at [facility] suggested maybe she has a PE.  She denies any dysuria.  She denies any diarrhea.  She states she is constipated.  She is tearful and stating that her symptoms have been getting worse and her headaches have been getting worse.  Past Medical History:  Diagnosis Date  . GERD (gastroesophageal reflux disease)   . Hypertension   . Wears contact lenses     Patient Active Problem List   Diagnosis Date Noted  . Special screening for malignant neoplasms, colon   . Benign neoplasm of cecum   . Polyp of sigmoid colon     Past Surgical History:  Procedure Laterality Date  . CHOLECYSTECTOMY    . COLONOSCOPY WITH PROPOFOL N/A 04/06/2017   Procedure: COLONOSCOPY WITH PROPOFOL;  Surgeon: Lucilla Lame, MD;  Location: [facility];  Service: Endoscopy;  Laterality: N/A;  requests early  . POLYPECTOMY  04/06/2017   Procedure: POLYPECTOMY INTESTINAL;  Surgeon: Lucilla Lame, MD;  Location: [facility];  Service: Endoscopy;;    Prior to Admission medications   Medication Sig Start Date End Date Taking? Authorizing  Provider  ASHWAGANDHA PO Take by mouth daily.    [provider]  aspirin 81 MG tablet Take 81 mg by mouth daily.    [provider]  B Complex Vitamins (B COMPLEX PO) Take by mouth daily.    [provider]  CALCIUM CARB-CHOLECALCIFEROL PO Take by mouth daily.    [provider]  Cholecalciferol (VITAMIN D PO) Take by mouth daily.    [provider]  GLUCOSAMINE-CHONDROITIN PO Take by mouth daily.    [provider]  MILK THISTLE PO Take by mouth daily.    [provider]  Multiple Vitamin (MULTIVITAMIN) tablet Take 1 tablet by mouth daily.    [provider]  NON FORMULARY daily. Tru Niagen    [provider]  Omega-3 Fatty Acids (FISH OIL PO) Take by mouth daily.    [provider]  Probiotic Product (PROBIOTIC PO) Take by mouth daily.    [provider]  RABEprazole (ACIPHEX) 20 MG tablet Take 20 mg by mouth 2 (two) times daily.    [provider]  Rosuvastatin Calcium (CRESTOR PO) Take by mouth daily.    [provider]  TURMERIC PO Take by mouth daily.    [provider]    Allergies Patient has no known allergies.  No family history on file.  Social History Social History   Tobacco Use  . Smoking status: Former Smoker  Last attempt to quit: 10/14/2016    Years since quitting: 0.8  . Smokeless tobacco: Never Used  Substance Use Topics  . Alcohol use: Yes    Alcohol/week: 8.4 oz    Types: 14 Glasses of wine per week  . Drug use: Not on file    Review of Systems  Constitutional: Positive fever/chills and headache Eyes: No visual changes. ENT: No sore throat.  No runny nose or congestion Respiratory: Denies cough, denies shortness of breath Cardiovascular: Denies chest pain Gastrointestinal: Complains of left upper quadrant and right lower quadrant abdominal pain.  She states she no longer has a gallbladder but does still have her  appendix Genitourinary: Negative for dysuria. Musculoskeletal: Negative for back pain. Skin: Negative for rash.    ____________________________________________   PHYSICAL EXAM:  VITAL SIGNS: ED Triage Vitals  Enc Vitals Group     BP 08/07/17 1532 135/67     Pulse Rate 08/07/17 1532 (!) 103     Resp 08/07/17 1738 20     Temp 08/07/17 1532 (!) 102.8 F (39.3 C)     Temp Source 08/07/17 1532 Oral     SpO2 08/07/17 1532 92 %     Weight 08/07/17 1530 203 lb (92.1 kg)     Height --      Head Circumference --      Peak Flow --      Pain Score 08/07/17 1529 10     Pain Loc --      Pain Edu? --      Excl. in [place]? --     Constitutional: Alert and oriented.  Does not appear to be toxic at this time.  No acute distress, however the patient is tearful  eyes: Conjunctivae are slightly icteric Head: Atraumatic. Nose: No congestion/rhinnorhea. Mouth/Throat: Mucous membranes are moist.  Throat is normal there is no redness or swelling Neck: Is supple, no lymphadenopathy is noted, C-spine is nontender, there are no meningeal signs noted Cardiovascular: Normal rate, regular rhythm.  Heart sounds are normal Respiratory: Normal respiratory effort.  No retractions, lungs are clear to auscultation Abdomen: Soft, tender in left upper quadrant and right lower quadrant, bowel sounds normal all 4 quadrants GU: deferred Musculoskeletal: FROM all extremities, warm and well perfused Neurologic:  Normal speech and language.  Skin:  Skin is warm, dry and intact. No rash noted. Psychiatric: Mood and affect are normal. Speech and behavior are normal.  ____________________________________________   LABS (all labs ordered are listed, but only abnormal results are displayed)  Labs Reviewed  BASIC METABOLIC PANEL - Abnormal; Notable for the following components:      Result Value   Sodium 133 (*)    Potassium 3.0 (*)    Chloride 95 (*)    Glucose, Bld 105 (*)    All other components within normal  limits  CBC - Abnormal; Notable for the following components:   Hemoglobin 11.9 (*)    HCT 33.8 (*)    All other components within normal limits  URINALYSIS, COMPLETE (UACMP) WITH MICROSCOPIC - Abnormal; Notable for the following components:   Color, Urine AMBER (*)    APPearance HAZY (*)    Hgb urine dipstick SMALL (*)    Ketones, ur 20 (*)    Protein, ur 30 (*)    Leukocytes, UA TRACE (*)    All other components within normal limits  ROCKY MTN SPOTTED FVR ABS PNL(IGG+IGM)  LYME DISEASE, WESTERN BLOT  HEPATIC FUNCTION PANEL  LIPASE, BLOOD  CBG  MONITORING, ED   ____________________________________________   ____________________________________________  RADIOLOGY  Chest x-ray shows a questionable infiltrate in the right lower lobe that the radiologist is reading as possible pneumonia due to the patient's fever. KUB is negative Chest CT for PE is ordered due to the patient's hypotension, decreased pulse ox on arrival  ____________________________________________   PROCEDURES  Procedure(s) performed: Saline lock, normal saline 1 L IV  Procedures    ____________________________________________   INITIAL IMPRESSION / ASSESSMENT AND PLAN / ED COURSE  Pertinent labs & imaging results that were available during my care of the patient were reviewed by me and considered in my medical decision making (see chart for details).  Patient is a 52 year old female presented emergency department complaining of fever, headache, body aches with nausea and decreased appetite.  She has no vomiting or diarrhea.  States she is constipated.  She has no burning upon urination.  She was seen at her regular doctor's "Dr. Doy Hutching ", she states that her hemoglobin and hematocrit were a little low, her white blood cell was normal and her urine was normal.  They did not test her for Lyme's or [facility] spotted fever.  She is concerned due to the fever and headache that this may be the problem.   Therefore Dr. Doy Hutching started her on doxycycline yesterday.  She has had 3 doses of this medication.  She also states that her daughter said that her eyes looked yellow the other day. Patient was a former smoker.  She quit one year ago and has a 30-pack-year history  Physical exam the patient is tearful she does not appear to be toxic.  The conjunctiva are a little faint yellow.  The neck is supple there is no meningeal type symptoms.  Lungs are clear to auscultation, heart sounds are normal, the abdomen is soft with mild tenderness in the left upper quadrant in the right lower quadrant.  No rashes noted on the extremities.  The patient has spider veins that are more pronounced on the ankles bilaterally.  Chest x-ray shows a questionable infiltrate in the right lower lung.  KUB is negative Chest CT for PE was ordered due to the patient's hypotension and decreased pulse ox upon arrival which was 92%.  Labs: White blood cell count 6.9, hemoglobin 11.9, hematocrit 33.8, UA is hazy with 20 ketones small amount of blood and trace of leukocytes with no bacteria present.  The basic metabolic panel has a sodium of 133 potassium of 3.0.  A hepatic panel was added onto the basic metabolic panel.  EKG shows tachycardia  Differential diagnosis includes pneumonia, PE, appendicitis, pyelonephritis, or [place] spotted fever/Lyme's   Report was given to Charlsie Merles, NP  As part of my medical decision making, I reviewed the following data within the [place] notes reviewed and incorporated, Labs reviewed as above, Patient signed out to Charlsie Merles, NP, Radiograph reviewed chest x-ray shows questionable pneumonia, KUB is normal, Notes from prior ED visits and [place] Controlled Substance Database  ____________________________________________   FINAL CLINICAL IMPRESSION(S) / ED DIAGNOSES  Final diagnoses:  Fever in adult  Hypokalemia  Hypotension, unspecified  hypotension type      NEW MEDICATIONS STARTED DURING THIS VISIT:  New Prescriptions   No medications on file     Note:  This document was prepared using Dragon voice recognition software and may include unintentional dictation errors.    Versie Starks, PA-C 08/07/17 1908    Paduchowski,  Lennette Bihari, MD 08/07/17 2034

## 2017-08-07 NOTE — ED Provider Notes (Signed)
-----------------------------------------   8:34 PM on 08/07/2017 -----------------------------------------   Blood pressure 106/65, pulse 82, temperature 99.1 F (37.3 C), temperature source Oral, resp. rate 18, weight 92.1 kg (203 lb), SpO2 97 %.  Assuming care from Ashok Cordia, [place].  In short, Lynn Small is a 52 y.o. female with a chief complaint of Headache and Dizziness .  Refer to the original H&P for additional details.  The current plan of care is to get CT of chest with contrast and reevaluate.  ----------------------------------------- 9:39 PM on 08/07/2017 -----------------------------------------  Chest CT confirms right lower lobe pneumonia.  While discussing results with patient and plan to give her IV Rocephin and then discharge her home for follow-up with her primary care provider, the patient continues to complain of headache that started 2 days prior to onset of any other symptoms.  Patient states that she does not typically get headaches at all and states that this is very unusual.  The headache is described as a throbbing, pounding, dull to sharp and shooting pain.  She states that it has not been in conjunction with fever.  She has not taken any medications for the headache.  She also reports generalized malaise and fatigue.  CT of the head was ordered and performed.  This is also negative for any acute findings.  At this time, feel that the patient stable for discharge.  We will treat her with Levaquin and have her stop the doxycycline until the results of the Lyme's and [facility] spotted fever testing has resulted.  Mono testing was also added at the end, the patient will be notified if this is positive.  She will be encouraged to rest and stay well-hydrated.  She is to call and schedule follow-up appointment with Dr. Doy Hutching for early next week.  She is to return to the emergency department for symptoms of change or worsen or for new symptoms of concern if she is unable to  schedule an appointment.     Victorino Dike, FNP 08/07/17 2217    Schuyler Amor, MD 08/08/17 [phone]

## 2017-08-11 LAB — LYME DISEASE, WESTERN BLOT
IGG P39 AB.: ABSENT
IGG P41 AB.: ABSENT
IGG P66 AB.: ABSENT
IGG P93 AB.: ABSENT
IgG P18 Ab.: ABSENT
IgG P23 Ab.: ABSENT
IgG P28 Ab.: ABSENT
IgG P30 Ab.: ABSENT
IgG P45 Ab.: ABSENT
IgG P58 Ab.: ABSENT
IgM P23 Ab.: ABSENT
IgM P39 Ab.: ABSENT
IgM P41 Ab.: ABSENT
LYME IGM WB: NEGATIVE
Lyme IgG Wb: NEGATIVE

## 2017-08-12 LAB — ROCKY MTN SPOTTED FVR ABS PNL(IGG+IGM)
RMSF IGG: NEGATIVE
RMSF IGM: 0.6 {index} (ref 0.00–0.89)

## 2017-08-13 DIAGNOSIS — J189 Pneumonia, unspecified organism: Secondary | ICD-10-CM | POA: Diagnosis not present

## 2017-08-13 DIAGNOSIS — Z79899 Other long term (current) drug therapy: Secondary | ICD-10-CM | POA: Diagnosis not present

## 2017-08-13 DIAGNOSIS — J181 Lobar pneumonia, unspecified organism: Secondary | ICD-10-CM | POA: Diagnosis not present

## 2017-09-09 DIAGNOSIS — R9389 Abnormal findings on diagnostic imaging of other specified body structures: Secondary | ICD-10-CM | POA: Diagnosis not present

## 2017-09-09 DIAGNOSIS — J984 Other disorders of lung: Secondary | ICD-10-CM | POA: Diagnosis not present

## 2017-11-20 DIAGNOSIS — E7800 Pure hypercholesterolemia, unspecified: Secondary | ICD-10-CM | POA: Diagnosis not present

## 2017-11-20 DIAGNOSIS — Z79899 Other long term (current) drug therapy: Secondary | ICD-10-CM | POA: Diagnosis not present

## 2017-11-20 DIAGNOSIS — E559 Vitamin D deficiency, unspecified: Secondary | ICD-10-CM | POA: Diagnosis not present

## 2017-11-20 DIAGNOSIS — I10 Essential (primary) hypertension: Secondary | ICD-10-CM | POA: Diagnosis not present

## 2017-11-20 DIAGNOSIS — R7309 Other abnormal glucose: Secondary | ICD-10-CM | POA: Diagnosis not present

## 2017-11-27 DIAGNOSIS — I8393 Asymptomatic varicose veins of bilateral lower extremities: Secondary | ICD-10-CM | POA: Diagnosis not present

## 2017-11-27 DIAGNOSIS — E782 Mixed hyperlipidemia: Secondary | ICD-10-CM | POA: Diagnosis not present

## 2017-11-27 DIAGNOSIS — I10 Essential (primary) hypertension: Secondary | ICD-10-CM | POA: Diagnosis not present

## 2017-11-27 DIAGNOSIS — Z1239 Encounter for other screening for malignant neoplasm of breast: Secondary | ICD-10-CM | POA: Diagnosis not present

## 2017-12-03 DIAGNOSIS — Z961 Presence of intraocular lens: Secondary | ICD-10-CM | POA: Diagnosis not present

## 2017-12-03 DIAGNOSIS — H04123 Dry eye syndrome of bilateral lacrimal glands: Secondary | ICD-10-CM | POA: Diagnosis not present

## 2017-12-03 DIAGNOSIS — H40013 Open angle with borderline findings, low risk, bilateral: Secondary | ICD-10-CM | POA: Diagnosis not present

## 2017-12-14 ENCOUNTER — Encounter (INDEPENDENT_AMBULATORY_CARE_PROVIDER_SITE_OTHER): Payer: BLUE CROSS/BLUE SHIELD | Admitting: Vascular Surgery

## 2017-12-25 ENCOUNTER — Other Ambulatory Visit: Payer: Self-pay

## 2017-12-25 ENCOUNTER — Encounter (INDEPENDENT_AMBULATORY_CARE_PROVIDER_SITE_OTHER): Payer: Self-pay | Admitting: Vascular Surgery

## 2017-12-25 ENCOUNTER — Ambulatory Visit (INDEPENDENT_AMBULATORY_CARE_PROVIDER_SITE_OTHER): Payer: BLUE CROSS/BLUE SHIELD | Admitting: Vascular Surgery

## 2017-12-25 VITALS — BP 128/76 | HR 73 | Resp 17 | Ht 67.0 in | Wt 192.0 lb

## 2017-12-25 DIAGNOSIS — E785 Hyperlipidemia, unspecified: Secondary | ICD-10-CM | POA: Diagnosis not present

## 2017-12-25 DIAGNOSIS — I10 Essential (primary) hypertension: Secondary | ICD-10-CM

## 2017-12-25 DIAGNOSIS — Z87891 Personal history of nicotine dependence: Secondary | ICD-10-CM

## 2017-12-25 DIAGNOSIS — I83813 Varicose veins of bilateral lower extremities with pain: Secondary | ICD-10-CM | POA: Diagnosis not present

## 2017-12-25 NOTE — Assessment & Plan Note (Signed)
blood pressure control important in reducing the progression of atherosclerotic disease. On appropriate oral medications.  

## 2017-12-25 NOTE — Progress Notes (Signed)
Patient ID: Lynn Small, female   DOB: 09-20-1965, 52 y.o.   MRN: [id number]  Chief Complaint  Patient presents with  . New Patient (Initial Visit)    Varicose veins    HPI Lynn Small is a 52 y.o. female.  I am asked to see the patient by Dr. Doy Hutching for evaluation of varicose veins.  The patient presents with complaints of symptomatic varicosities of the lower extrermities. The patient reports a long standing history of varicosities and they have become painful over time. There was no clear inciting event or causative factor that started the symptoms.  The right leg is more severly affected. The patient elevates the legs for relief. The pain is described as heaviness and aching in the leg. The symptoms are generally most severe in the evening, particularly when they have been on their feet for long periods of time.  He was undergone some sort of venous treatment about 6 or 7 years ago but I do not have those records for review immediately.  She thinks she may have had laser and sclerotherapy.  She has a mark on her left medial calf that she says came up after sclerotherapy that is a dark ring of discoloration.  Elevation has been used to try to improve the symptoms with limited success. The patient complains of frequent swelling as an associated symptom. The patient has no previous history of deep venous thrombosis or superficial thrombophlebitis to their knowledge.     Past Medical History:  Diagnosis Date  . GERD (gastroesophageal reflux disease)   . Hypertension   . Wears contact lenses     Past Surgical History:  Procedure Laterality Date  . CHOLECYSTECTOMY    . COLONOSCOPY WITH PROPOFOL N/A 04/06/2017   Procedure: COLONOSCOPY WITH PROPOFOL;  Surgeon: Lucilla Lame, MD;  Location: [place];  Service: Endoscopy;  Laterality: N/A;  requests early  . POLYPECTOMY  04/06/2017   Procedure: POLYPECTOMY INTESTINAL;  Surgeon: Lucilla Lame, MD;  Location: [place];   Service: Endoscopy;;    Family History No bleeding disorders, clotting disorders, porphyrias, or aneurysms  Social History Social History   Tobacco Use  . Smoking status: Former Smoker    Last attempt to quit: 10/14/2016    Years since quitting: 1.1  . Smokeless tobacco: Never Used  Substance Use Topics  . Alcohol use: Yes    Alcohol/week: 14.0 standard drinks    Types: 14 Glasses of wine per week  . Drug use: Not on file    No Known Allergies  Current Outpatient Medications  Medication Sig Dispense Refill  . ASHWAGANDHA PO Take by mouth daily.    . B Complex Vitamins (B COMPLEX PO) Take by mouth daily.    Marland Kitchen CALCIUM CARB-CHOLECALCIFEROL PO Take by mouth daily.    . Cholecalciferol (VITAMIN D PO) Take by mouth daily.    Marland Kitchen GLUCOSAMINE-CHONDROITIN PO Take by mouth daily.    Marland Kitchen MILK THISTLE PO Take by mouth daily.    . Multiple Vitamin (MULTIVITAMIN) tablet Take 1 tablet by mouth daily.    . NON FORMULARY daily. Tru Niagen    . Omega-3 Fatty Acids (FISH OIL PO) Take by mouth daily.    . Probiotic Product (PROBIOTIC PO) Take by mouth daily.    . RABEprazole (ACIPHEX) 20 MG tablet Take 20 mg by mouth 2 (two) times daily.    . TURMERIC PO Take by mouth daily.    Marland Kitchen aspirin 81 MG tablet Take  81 mg by mouth daily.    . predniSONE (DELTASONE) 10 MG tablet Take 5 tablets (50 mg total) by mouth daily. (Patient not taking: Reported on 12/25/2017) 25 tablet 0  . Rosuvastatin Calcium (CRESTOR PO) Take by mouth daily.     No current facility-administered medications for this visit.       REVIEW OF SYSTEMS (Negative unless checked)  Constitutional: [] Weight loss  [] Fever  [] Chills Cardiac: [] Chest pain   [] Chest pressure   [] Palpitations   [] Shortness of breath when laying flat   [] Shortness of breath at rest   [] Shortness of breath with exertion. Vascular:  [] Pain in legs with walking   [] Pain in legs at rest   [] Pain in legs when laying flat   [] Claudication   [] Pain in feet when  walking  [] Pain in feet at rest  [] Pain in feet when laying flat   [] History of DVT   [] Phlebitis   [x] Swelling in legs   [x] Varicose veins   [] Non-healing ulcers Pulmonary:   [] Uses home oxygen   [] Productive cough   [] Hemoptysis   [] Wheeze  [] COPD   [] Asthma Neurologic:  [] Dizziness  [] Blackouts   [] Seizures   [] History of stroke   [] History of TIA  [] Aphasia   [] Temporary blindness   [] Dysphagia   [] Weakness or numbness in arms   [] Weakness or numbness in legs Musculoskeletal:  [] Arthritis   [] Joint swelling   [] Joint pain   [] Low back pain Hematologic:  [] Easy bruising  [] Easy bleeding   [] Hypercoagulable state   [] Anemic  [] Hepatitis Gastrointestinal:  [] Blood in stool   [] Vomiting blood  [] Gastroesophageal reflux/heartburn   [] Abdominal pain Genitourinary:  [] Chronic kidney disease   [] Difficult urination  [] Frequent urination  [] Burning with urination   [] Hematuria Skin:  [] Rashes   [] Ulcers   [] Wounds Psychological:  [] History of anxiety   []  History of major depression.    Physical Exam BP 128/76   Pulse 73   Resp 17   Ht 5\' 7"  (1.702 m)   Wt 192 lb (87.1 kg)   BMI 30.07 kg/m  Gen:  WD/WN, NAD. Appears younger than stated age. Head: [place]/AT, No temporalis wasting.  Ear/Nose/Throat: Hearing grossly intact, dentition good Eyes: Sclera non-icteric. Conjunctiva clear Neck: Supple. Trachea midline Pulmonary:  Good air movement, no use of accessory muscles, respirations not labored.  Cardiac: RRR, No JVD Vascular: Varicosities extenisve and measuring up to 3 mm in the right lower extremity        Varicosities diffuse and measuring up to 2 mm in the left lower extremity Vessel Right Left  Radial Palpable Palpable                          PT Palpable Palpable  DP Palpable Palpable   Gastrointestinal: soft, non-tender/non-distended.  Musculoskeletal: M/S 5/5 throughout.   1 + RLE edema.  trace LLE edema Neurologic: Sensation grossly intact in extremities.  Symmetrical.  Speech  is fluent.  Psychiatric: Judgment intact, Mood & affect appropriate for pt's clinical situation. Dermatologic: No rashes or ulcers noted.  No cellulitis or open wounds.    Radiology No results found.  Labs No results found for this or any previous visit (from the past 2160 hour(s)).  Assessment/Plan:  Hypertension blood pressure control important in reducing the progression of atherosclerotic disease. On appropriate oral medications.   Hyperlipidemia lipid control important in reducing the progression of atherosclerotic disease. Continue statin therapy   Varicose veins of leg with  pain, bilateral   The patient has symptoms consistent with chronic venous insufficiency. We discussed the natural history and treatment options for venous disease. I recommended the regular use of 20 - 30 mm Hg compression stockings, and prescribed these today. I recommended leg elevation and anti-inflammatories as needed for pain. I have also recommended a complete venous duplex to assess the venous system for reflux or thrombotic issues. This can be done at the patient's convenience. I will see the patient back in 3 months to assess the response to conservative management, and determine further treatment options.     Leotis Pain 12/25/2017, 11:01 AM   This note was created with Dragon medical transcription system.  Any errors from dictation are unintentional.

## 2017-12-25 NOTE — Assessment & Plan Note (Signed)
lipid control important in reducing the progression of atherosclerotic disease. Continue statin therapy  

## 2017-12-25 NOTE — Patient Instructions (Signed)

## 2018-01-10 DIAGNOSIS — L03011 Cellulitis of right finger: Secondary | ICD-10-CM | POA: Diagnosis not present

## 2018-02-02 DIAGNOSIS — J029 Acute pharyngitis, unspecified: Secondary | ICD-10-CM | POA: Diagnosis not present

## 2018-03-30 ENCOUNTER — Ambulatory Visit (INDEPENDENT_AMBULATORY_CARE_PROVIDER_SITE_OTHER): Payer: BLUE CROSS/BLUE SHIELD

## 2018-03-30 ENCOUNTER — Encounter (INDEPENDENT_AMBULATORY_CARE_PROVIDER_SITE_OTHER): Payer: Self-pay | Admitting: Vascular Surgery

## 2018-03-30 ENCOUNTER — Ambulatory Visit (INDEPENDENT_AMBULATORY_CARE_PROVIDER_SITE_OTHER): Payer: BLUE CROSS/BLUE SHIELD | Admitting: Vascular Surgery

## 2018-03-30 VITALS — BP 133/88 | HR 79 | Resp 16 | Ht 67.0 in | Wt 190.2 lb

## 2018-03-30 DIAGNOSIS — I83812 Varicose veins of left lower extremities with pain: Secondary | ICD-10-CM | POA: Diagnosis not present

## 2018-03-30 DIAGNOSIS — I83813 Varicose veins of bilateral lower extremities with pain: Secondary | ICD-10-CM

## 2018-03-30 DIAGNOSIS — E785 Hyperlipidemia, unspecified: Secondary | ICD-10-CM

## 2018-03-30 DIAGNOSIS — I10 Essential (primary) hypertension: Secondary | ICD-10-CM | POA: Diagnosis not present

## 2018-03-30 DIAGNOSIS — I83811 Varicose veins of right lower extremities with pain: Secondary | ICD-10-CM

## 2018-03-30 NOTE — Progress Notes (Signed)
MRN : [id number]  Lynn Small is a 53 y.o. ([birth year]) female who presents with chief complaint of  Chief Complaint  Patient presents with  . Follow-up  .  History of Present Illness: Patient returns today in follow up of her venous disease.  She has been wearing compression stockings regularly and elevating her legs regularly.  Her legs feel tired and heavy despite this. Antiinflammatories help a little.  She has been doing this for over three months. Her venous reflux study shows what is likely a duplicate or accessory left great saphenous vein system with reflux throughout the thigh.  There is some deep venous reflux bilaterally  Current Outpatient Medications  Medication Sig Dispense Refill  . ASHWAGANDHA PO Take by mouth daily.    . B Complex Vitamins (B COMPLEX PO) Take by mouth daily.    Marland Kitchen CALCIUM CARB-CHOLECALCIFEROL PO Take by mouth daily.    . Cholecalciferol (VITAMIN D PO) Take by mouth daily.    Marland Kitchen GLUCOSAMINE-CHONDROITIN PO Take by mouth daily.    Marland Kitchen MILK THISTLE PO Take by mouth daily.    . Multiple Vitamin (MULTIVITAMIN) tablet Take 1 tablet by mouth daily.    . NON FORMULARY daily. Tru Niagen    . Omega-3 Fatty Acids (FISH OIL PO) Take by mouth daily.    . Probiotic Product (PROBIOTIC PO) Take by mouth daily.    . RABEprazole (ACIPHEX) 20 MG tablet Take 20 mg by mouth 2 (two) times daily.    . TURMERIC PO Take by mouth daily.     No current facility-administered medications for this visit.     Past Medical History:  Diagnosis Date  . GERD (gastroesophageal reflux disease)   . Hypertension   . Wears contact lenses     Past Surgical History:  Procedure Laterality Date  . CHOLECYSTECTOMY    . COLONOSCOPY WITH PROPOFOL N/A 04/06/2017   Procedure: COLONOSCOPY WITH PROPOFOL;  Surgeon: Lucilla Lame, MD;  Location: [place];  Service: Endoscopy;  Laterality: N/A;  requests early  . POLYPECTOMY  04/06/2017   Procedure: POLYPECTOMY INTESTINAL;  Surgeon:  Lucilla Lame, MD;  Location[facility];  Service: Endoscopy;;    Social History Social History   Tobacco Use  . Smoking status: Former Smoker    Last attempt to quit: 10/14/2016    Years since quitting: 1.4  . Smokeless tobacco: Never Used  Substance Use Topics  . Alcohol use: Yes    Alcohol/week: 14.0 standard drinks    Types: 14 Glasses of wine per week  . Drug use: Not on file    Family History No bleeding or clotting disorders  No Known Allergies   REVIEW OF SYSTEMS (Negative unless checked)  Constitutional: [] Weight loss  [] Fever  [] Chills Cardiac: [] Chest pain   [] Chest pressure   [] Palpitations   [] Shortness of breath when laying flat   [] Shortness of breath at rest   [] Shortness of breath with exertion. Vascular:  [] Pain in legs with walking   [] Pain in legs at rest   [] Pain in legs when laying flat   [] Claudication   [] Pain in feet when walking  [] Pain in feet at rest  [] Pain in feet when laying flat   [] History of DVT   [] Phlebitis   [x] Swelling in legs   [x] Varicose veins   [] Non-healing ulcers Pulmonary:   [] Uses home oxygen   [] Productive cough   [] Hemoptysis   [] Wheeze  [] COPD   [] Asthma Neurologic:  [] Dizziness  [] Blackouts   []   Seizures   [] History of stroke   [] History of TIA  [] Aphasia   [] Temporary blindness   [] Dysphagia   [] Weakness or numbness in arms   [] Weakness or numbness in legs Musculoskeletal:  [] Arthritis   [] Joint swelling   [] Joint pain   [] Low back pain Hematologic:  [] Easy bruising  [] Easy bleeding   [] Hypercoagulable state   [] Anemic   Gastrointestinal:  [] Blood in stool   [] Vomiting blood  [x] Gastroesophageal reflux/heartburn   [] Abdominal pain Genitourinary:  [] Chronic kidney disease   [] Difficult urination  [] Frequent urination  [] Burning with urination   [] Hematuria Skin:  [] Rashes   [] Ulcers   [] Wounds Psychological:  [] History of anxiety   []  History of major depression.  Physical Examination  BP 133/88 (BP Location: Right Arm,  Patient Position: Sitting)   Pulse 79   Resp 16   Ht 5\' 7"  (1.702 m)   Wt 190 lb 3.2 oz (86.3 kg)   BMI 29.79 kg/m  Gen:  WD/WN, NAD Head: [place]/AT, No temporalis wasting. Ear/Nose/Throat: Hearing grossly intact, nares w/o erythema or drainage Eyes: Conjunctiva clear. Sclera non-icteric Neck: Supple.  Trachea midline Pulmonary:  Good air movement, no use of accessory muscles.  Cardiac: RRR, no JVD Vascular: Fairly extensive varicosities on the lower legs in both lower extremities with some larger varicosities in the 3 to 4 mm range bilaterally as well as some fairly extensive spider varicosities. Vessel Right Left  Radial Palpable Palpable                          PT Palpable Palpable  DP Palpable Palpable    Musculoskeletal: M/S 5/5 throughout.  No deformity or atrophy. Trace BLE edema. Neurologic: Sensation grossly intact in extremities.  Symmetrical.  Speech is fluent.  Psychiatric: Judgment intact, Mood & affect appropriate for pt's clinical situation. Dermatologic: No rashes or ulcers noted.  No cellulitis or open wounds.       Labs No results found for this or any previous visit (from the past 2160 hour(s)).  Radiology No results found.  Assessment/Plan Hypertension blood pressure control important in reducing the progression of atherosclerotic disease. On appropriate oral medications.   Hyperlipidemia lipid control important in reducing the progression of atherosclerotic disease. Continue statin therapy  Varicose veins of leg with pain, bilateral The patient has symptomatic varicosities of both lower extremities.  Her venous reflux study shows what is likely a duplicate or accessory left great saphenous vein system with reflux throughout the thigh.  There is some deep venous reflux bilaterally.  Given her continued symptoms and these findings, laser ablation of the left residual saphenous vein system with reflux would be prudent.  Sclerotherapy and foam  sclerotherapy would also be a good option to treat her symptomatic varicosities of both lower legs and help with the pain and heaviness in her legs.  Risks and benefits of these procedures were discussed with the patient in detail and she is agreeable to proceed.    Leotis Pain, MD  03/30/2018 10:56 AM    This note was created with Dragon medical transcription system.  Any errors from dictation are purely unintentional

## 2018-03-30 NOTE — Assessment & Plan Note (Signed)
The patient has symptomatic varicosities of both lower extremities.  Her venous reflux study shows what is likely a duplicate or accessory left great saphenous vein system with reflux throughout the thigh.  There is some deep venous reflux bilaterally.  Given her continued symptoms and these findings, laser ablation of the left residual saphenous vein system with reflux would be prudent.  Sclerotherapy and foam sclerotherapy would also be a good option to treat her symptomatic varicosities of both lower legs and help with the pain and heaviness in her legs.  Risks and benefits of these procedures were discussed with the patient in detail and she is agreeable to proceed.

## 2018-03-30 NOTE — Patient Instructions (Signed)
Nonsurgical Procedures for Varicose Veins Various nonsurgical procedures can be used to treat varicose veins. Varicose veins are swollen, twisted veins that are visible under the skin. They occur most often in the legs. These veins may appear blue and bulging. Varicose veins are caused by damage to the valves in veins. All veins have a valve that makes blood flow in only one direction. If a valve gets weak or damaged, blood can pool and cause varicose veins. You may need a procedure to treat your varicose veins if they are causing symptoms or complications, or if lifestyle changes have not helped. These procedures can reduce pain, aching, and the risk of bleeding and blood clots. They can also improve the way the affected area looks (cosmetic appearance). The three common nonsurgical procedures are:  Sclerotherapy. A chemical is injected to close off a vein.  Laser treatment. Light energy is applied to close off the vein.  Radiofrequency vein ablation. Electrical energy is used to produce heat that closes off the vein. Your health care provider will discuss the method that is best for you based on your condition. Tell a health care provider about:  Any allergies you have.  All medicines you are taking, including vitamins, herbs, eye drops, creams, and over-the-counter medicines.  Any problems you or family members have had with anesthetic medicines.  Any blood disorders you have.  Any surgeries you have had.  Any medical conditions you have.  Whether you are pregnant or may be pregnant. What are the risks? Generally, this is a safe procedure. However, problems may occur, including:  Damage to nearby nerves, tissues, or veins.  Skin irritation, sores, or dark spots.  Numbness.  Clotting.  Infection.  Allergic reactions to medicines.  Scarring.  Leg swelling.  Need for additional treatments.  Bruising. What happens before the procedure?  Ask your health care provider  about: ? Changing or stopping your regular medicines. This is especially important if you are taking diabetes medicines or blood thinners. ? Taking over-the-counter medicines, vitamins, herbs, and supplements. ? Taking medicines such as aspirin and ibuprofen. These medicines can thin your blood. Do not take these medicines unless your health care provider tells you to take them.  You may have an exam or testing. This can include a tests to: ? Check for clots and check blood flow using sound waves (Doppler ultrasound). ? Observe how blood flows through your veins by injecting a dye that outlines your veins on X-rays (angiogram). This test is used in rare cases. What happens during the procedure? One of the following procedures will be performed: Sclerotherapy This procedure is often used for small to medium veins.  A chemical (sclerosant) that irritates the lining of the vein will be injected into the vein. This will cause the varicose vein to be closed off. Sclerosants in different amounts and strengths can be used, depending on the size and location of the vein.  All of the varicose vein sites will be injected. You may need more than one treatment because new varicose veins may develop, or more than one injection may be needed for each varicose vein.  Laser treatment There are two ways that lasers are used to treat varicose veins:  Light energy from a laser may be directed onto the vein through the skin.  A needle may be used to pass a thin laser catheter into the vein to cause it to close. You may need more than one treatment if the vein re-opens. In some cases,   laser treatment may be combined with sclerotherapy. Radiofrequency vein ablation   You will be given a medicine that numbs the area (local anesthetic).  A small incision will be made near the varicose vein.  A thin tube (catheter) will be threaded into your vein.  The tip of the catheter will deploy electrodes.  The  electrodes will deliver electrical energy to produce heat that closes off the vein. What happens after the procedure?  A bandage (dressing) may be used to cover the injection site or incisions.  You may have to wear compression stockings. These stockings help to prevent blood clots and reduce swelling in your legs.  Return to your normal activities as told by your health care provider. Summary  Varicose veins are swollen, twisted veins that are visible under the skin. They occur most often in the legs.  Various procedures can be used to treat varicose veins. You may need a procedure to treat your varicose veins if they are causing symptoms or complications, or if lifestyle changes have not helped.  Your health care provider will discuss the method that is best for you based on your condition. This information is not intended to replace advice given to you by your health care provider. Make sure you discuss any questions you have with your health care provider. Document Released: 06/13/2016 Document Revised: 06/13/2016 Document Reviewed: 06/13/2016 Elsevier Interactive Patient Education  2019 [employer]  

## 2018-04-26 ENCOUNTER — Other Ambulatory Visit: Payer: Self-pay | Admitting: Internal Medicine

## 2018-04-26 DIAGNOSIS — Z1231 Encounter for screening mammogram for malignant neoplasm of breast: Secondary | ICD-10-CM

## 2018-05-07 ENCOUNTER — Ambulatory Visit
Admission: RE | Admit: 2018-05-07 | Discharge: 2018-05-07 | Disposition: A | Discharge status: Home / Self Care | Payer: BLUE CROSS/BLUE SHIELD | Source: Ambulatory Visit | Attending: Internal Medicine | Admitting: Internal Medicine

## 2018-05-07 DIAGNOSIS — Z1231 Encounter for screening mammogram for malignant neoplasm of breast: Secondary | ICD-10-CM | POA: Diagnosis not present

## 2018-05-26 DIAGNOSIS — E782 Mixed hyperlipidemia: Secondary | ICD-10-CM | POA: Diagnosis not present

## 2018-06-09 DIAGNOSIS — E782 Mixed hyperlipidemia: Secondary | ICD-10-CM | POA: Diagnosis not present

## 2018-06-09 DIAGNOSIS — Z13820 Encounter for screening for osteoporosis: Secondary | ICD-10-CM | POA: Diagnosis not present

## 2018-06-09 DIAGNOSIS — Z79899 Other long term (current) drug therapy: Secondary | ICD-10-CM | POA: Diagnosis not present

## 2018-06-09 DIAGNOSIS — I10 Essential (primary) hypertension: Secondary | ICD-10-CM | POA: Diagnosis not present

## 2018-06-18 ENCOUNTER — Other Ambulatory Visit (INDEPENDENT_AMBULATORY_CARE_PROVIDER_SITE_OTHER): Payer: BLUE CROSS/BLUE SHIELD | Admitting: Vascular Surgery

## 2018-06-21 ENCOUNTER — Encounter (INDEPENDENT_AMBULATORY_CARE_PROVIDER_SITE_OTHER): Payer: BLUE CROSS/BLUE SHIELD

## 2018-07-06 DIAGNOSIS — F3341 Major depressive disorder, recurrent, in partial remission: Secondary | ICD-10-CM | POA: Diagnosis not present

## 2018-07-11 DIAGNOSIS — F331 Major depressive disorder, recurrent, moderate: Secondary | ICD-10-CM | POA: Diagnosis not present

## 2018-07-11 DIAGNOSIS — F4322 Adjustment disorder with anxiety: Secondary | ICD-10-CM | POA: Diagnosis not present

## 2018-07-11 DIAGNOSIS — F5105 Insomnia due to other mental disorder: Secondary | ICD-10-CM | POA: Diagnosis not present

## 2018-07-13 DIAGNOSIS — N8111 Cystocele, midline: Secondary | ICD-10-CM | POA: Diagnosis not present

## 2018-07-29 DIAGNOSIS — F4322 Adjustment disorder with anxiety: Secondary | ICD-10-CM | POA: Diagnosis not present

## 2018-07-29 DIAGNOSIS — F5105 Insomnia due to other mental disorder: Secondary | ICD-10-CM | POA: Diagnosis not present

## 2018-07-29 DIAGNOSIS — F331 Major depressive disorder, recurrent, moderate: Secondary | ICD-10-CM | POA: Diagnosis not present

## 2018-08-03 ENCOUNTER — Ambulatory Visit (INDEPENDENT_AMBULATORY_CARE_PROVIDER_SITE_OTHER): Payer: BLUE CROSS/BLUE SHIELD | Admitting: Vascular Surgery

## 2018-08-03 ENCOUNTER — Other Ambulatory Visit: Payer: Self-pay

## 2018-08-03 ENCOUNTER — Encounter (INDEPENDENT_AMBULATORY_CARE_PROVIDER_SITE_OTHER): Payer: Self-pay | Admitting: Vascular Surgery

## 2018-08-03 VITALS — BP 127/82 | HR 72 | Resp 16 | Wt 208.6 lb

## 2018-08-03 DIAGNOSIS — I83813 Varicose veins of bilateral lower extremities with pain: Secondary | ICD-10-CM | POA: Diagnosis not present

## 2018-08-03 NOTE — Progress Notes (Signed)
  SILVANA HOLECEK is a 53 y.o. female who presents with symptomatic venous reflux  Past Medical History:  Diagnosis Date  . GERD (gastroesophageal reflux disease)   . Hypertension   . Wears contact lenses     Past Surgical History:  Procedure Laterality Date  . CHOLECYSTECTOMY    . COLONOSCOPY WITH PROPOFOL N/A 04/06/2017   Procedure: COLONOSCOPY WITH PROPOFOL;  Surgeon: Lucilla Lame, MD;  Location: [place];  Service: Endoscopy;  Laterality: N/A;  requests early  . POLYPECTOMY  04/06/2017   Procedure: POLYPECTOMY INTESTINAL;  Surgeon: Lucilla Lame, MD;  Location: [place];  Service: Endoscopy;;     Current Outpatient Medications:  .  ASHWAGANDHA PO, Take by mouth daily., Disp: , Rfl:  .  B Complex Vitamins (B COMPLEX PO), Take by mouth daily., Disp: , Rfl:  .  CALCIUM CARB-CHOLECALCIFEROL PO, Take by mouth daily., Disp: , Rfl:  .  Cholecalciferol (VITAMIN D PO), Take by mouth daily., Disp: , Rfl:  .  GLUCOSAMINE-CHONDROITIN PO, Take by mouth daily., Disp: , Rfl:  .  MILK THISTLE PO, Take by mouth daily., Disp: , Rfl:  .  Multiple Vitamin (MULTIVITAMIN) tablet, Take 1 tablet by mouth daily., Disp: , Rfl:  .  NON FORMULARY, daily. Tru Niagen, Disp: , Rfl:  .  Omega-3 Fatty Acids (FISH OIL PO), Take by mouth daily., Disp: , Rfl:  .  Probiotic Product (PROBIOTIC PO), Take by mouth daily., Disp: , Rfl:  .  RABEprazole (ACIPHEX) 20 MG tablet, Take 20 mg by mouth 2 (two) times daily., Disp: , Rfl:  .  TURMERIC PO, Take by mouth daily., Disp: , Rfl:   No Known Allergies   Varicose veins of leg with pain, bilateral     PLAN: The patient's left lower extremity was sterilely prepped and draped. The ultrasound machine was used to visualize the saphenous vein throughout its course. A segment in the distal thigh was selected for access. The saphenous vein was accessed without difficulty using ultrasound guidance with a micropuncture needle. A 0.018 wire was then placed  beyond the saphenofemoral junction and the needle was removed. The 65 cm sheath was then placed over the wire and the wire and dilator were removed. The laser fiber was then placed through the sheath and its tip was placed approximately 4 centimeters below the saphenofemoral junction. Tumescent anesthesia was then created with a dilute lidocaine solution. Laser energy was then delivered with constant withdrawal of the sheath and laser fiber. Approximately 952 joules of energy were delivered over a length of 19 centimeters using a 1470 Hz VenaCure machine at 7 W. Sterile dressings were placed. The patient tolerated the procedure well without obvious complications.   Follow-up in 1 week with post-laser duplex.

## 2018-08-06 ENCOUNTER — Other Ambulatory Visit: Payer: Self-pay

## 2018-08-06 ENCOUNTER — Ambulatory Visit (INDEPENDENT_AMBULATORY_CARE_PROVIDER_SITE_OTHER): Payer: BLUE CROSS/BLUE SHIELD

## 2018-08-06 DIAGNOSIS — I83813 Varicose veins of bilateral lower extremities with pain: Secondary | ICD-10-CM

## 2018-08-11 DIAGNOSIS — F331 Major depressive disorder, recurrent, moderate: Secondary | ICD-10-CM | POA: Diagnosis not present

## 2018-08-18 DIAGNOSIS — F5105 Insomnia due to other mental disorder: Secondary | ICD-10-CM | POA: Diagnosis not present

## 2018-08-18 DIAGNOSIS — F4322 Adjustment disorder with anxiety: Secondary | ICD-10-CM | POA: Diagnosis not present

## 2018-08-18 DIAGNOSIS — F331 Major depressive disorder, recurrent, moderate: Secondary | ICD-10-CM | POA: Diagnosis not present

## 2018-08-25 DIAGNOSIS — F331 Major depressive disorder, recurrent, moderate: Secondary | ICD-10-CM | POA: Diagnosis not present

## 2018-08-27 ENCOUNTER — Encounter (INDEPENDENT_AMBULATORY_CARE_PROVIDER_SITE_OTHER): Payer: Self-pay | Admitting: Nurse Practitioner

## 2018-08-27 ENCOUNTER — Other Ambulatory Visit: Payer: Self-pay

## 2018-08-27 ENCOUNTER — Ambulatory Visit (INDEPENDENT_AMBULATORY_CARE_PROVIDER_SITE_OTHER): Payer: BC Managed Care – PPO | Admitting: Nurse Practitioner

## 2018-08-27 VITALS — BP 132/86 | HR 83 | Resp 16 | Ht 67.0 in | Wt 210.4 lb

## 2018-08-27 DIAGNOSIS — Z87891 Personal history of nicotine dependence: Secondary | ICD-10-CM

## 2018-08-27 DIAGNOSIS — E785 Hyperlipidemia, unspecified: Secondary | ICD-10-CM

## 2018-08-27 DIAGNOSIS — I83813 Varicose veins of bilateral lower extremities with pain: Secondary | ICD-10-CM

## 2018-08-27 DIAGNOSIS — Z9889 Other specified postprocedural states: Secondary | ICD-10-CM | POA: Diagnosis not present

## 2018-08-27 DIAGNOSIS — I10 Essential (primary) hypertension: Secondary | ICD-10-CM | POA: Diagnosis not present

## 2018-08-27 DIAGNOSIS — Z79899 Other long term (current) drug therapy: Secondary | ICD-10-CM

## 2018-08-27 NOTE — Progress Notes (Signed)
SUBJECTIVE:  Patient ID: Lynn Small, female    DOB: [birth year], 53 y.o.   MRN: [id number] Chief Complaint  Patient presents with  . Follow-up    4week post laser    HPI  Lynn Small is a 53 y.o. female The patient returns to the office for followup status post laser ablation of the left great saphenous vein on 08/03/2018.  The patient note significant improvement in the lower extremity pain but not resolution of the symptoms. The patient notes multiple residual varicosities bilaterally which continued to hurt with dependent positions and remained tender to palpation. The patient also continues to have significant pain in her right lower extremity which showed an accessory saphenous vein.  Her right GSV is closed from previous ablation. The patient's swelling is minimally from preoperative status. The patient continues to wear graduated compression stockings on a daily basis but these are not eliminating the pain and discomfort. The patient continues to use over-the-counter anti-inflammatory medications to treat the pain and related symptoms but this has not given the patient relief. The patient notes the pain in the lower extremities is causing problems with daily exercise, problems at work and even with household activities such as preparing meals and doing dishes.    The patient is otherwise done well and there have been no complications related to the laser procedure or interval changes in the patient's overall   Post laser ultrasound shows successful ablation of the left great saphenous vein with no evidence of DVT.    Past Medical History:  Diagnosis Date  . GERD (gastroesophageal reflux disease)   . Hypertension   . Wears contact lenses     Past Surgical History:  Procedure Laterality Date  . CHOLECYSTECTOMY    . COLONOSCOPY WITH PROPOFOL N/A 04/06/2017   Procedure: COLONOSCOPY WITH PROPOFOL;  Surgeon: Lucilla Lame, MD;  Location: [place];  Service: Endoscopy;   Laterality: N/A;  requests early  . POLYPECTOMY  04/06/2017   Procedure: POLYPECTOMY INTESTINAL;  Surgeon: Lucilla Lame, MD;  Location: [facility];  Service: Endoscopy;;    Social History   Socioeconomic History  . Marital status: Married    Spouse name: Not on file  . Number of children: Not on file  . Years of education: Not on file  . Highest education level: Not on file  Occupational History  . Not on file  Social Needs  . Financial resource strain: Not on file  . Food insecurity    Worry: Not on file    Inability: Not on file  . Transportation needs    Medical: Not on file    Non-medical: Not on file  Tobacco Use  . Smoking status: Former Smoker    Quit date: 10/14/2016    Years since quitting: 1.8  . Smokeless tobacco: Never Used  Substance and Sexual Activity  . Alcohol use: Yes    Alcohol/week: 14.0 standard drinks    Types: 14 Glasses of wine per week  . Drug use: Not on file  . Sexual activity: Not on file  Lifestyle  . Physical activity    Days per week: Not on file    Minutes per session: Not on file  . Stress: Not on file  Relationships  . Social Herbalist on phone: Not on file    Gets together: Not on file    Attends religious service: Not on file    Active member of club or organization: Not on  file    Attends meetings of clubs or organizations: Not on file    Relationship status: Not on file  . Intimate partner violence    Fear of current or ex partner: Not on file    Emotionally abused: Not on file    Physically abused: Not on file    Forced sexual activity: Not on file  Other Topics Concern  . Not on file  Social History Narrative  . Not on file    History reviewed. No pertinent family history.  No Known Allergies   Review of Systems   Review of Systems: Negative Unless Checked Constitutional: [] Weight loss  [] Fever  [] Chills Cardiac: [] Chest pain   []  Atrial Fibrillation  [] Palpitations   [] Shortness of breath  when laying flat   [] Shortness of breath with exertion. [] Shortness of breath at rest Vascular:  [] Pain in legs with walking   [] Pain in legs with standing [] Pain in legs when laying flat   [] Claudication    [] Pain in feet when laying flat    [] History of DVT   [] Phlebitis   [] Swelling in legs   [x] Varicose veins   [] Non-healing ulcers Pulmonary:   [] Uses home oxygen   [] Productive cough   [] Hemoptysis   [] Wheeze  [] COPD   [] Asthma Neurologic:  [] Dizziness   [] Seizures  [] Blackouts [] History of stroke   [] History of TIA  [] Aphasia   [] Temporary Blindness   [] Weakness or numbness in arm   [] Weakness or numbness in leg Musculoskeletal:   [] Joint swelling   [] Joint pain   [] Low back pain  []  History of Knee Replacement [] Arthritis [] back Surgeries  []  Spinal Stenosis    Hematologic:  [] Easy bruising  [] Easy bleeding   [] Hypercoagulable state   [] Anemic Gastrointestinal:  [] Diarrhea   [] Vomiting  [] Gastroesophageal reflux/heartburn   [] Difficulty swallowing. [] Abdominal pain Genitourinary:  [] Chronic kidney disease   [] Difficult urination  [] Anuric   [] Blood in urine [] Frequent urination  [] Burning with urination   [] Hematuria Skin:  [] Rashes   [] Ulcers [] Wounds Psychological:  [] History of anxiety   []  History of major depression  []  Memory Difficulties      OBJECTIVE:   Physical Exam  BP 132/86 (BP Location: Right Arm)   Pulse 83   Resp 16   Ht 5\' 7"  (1.702 m)   Wt 210 lb 6.4 oz (95.4 kg)   BMI 32.95 kg/m   Gen: WD/WN, NAD Head: [place]/AT, No temporalis wasting.  Ear/Nose/Throat: Hearing grossly intact, nares w/o erythema or drainage Eyes: PER, EOMI, sclera nonicteric.  Neck: Supple, no masses.  No JVD.  Pulmonary:  Good air movement, no use of accessory muscles.  Cardiac: RRR Vascular: scattered varicosities present bilaterally.  Mild venous stasis changes to the legs bilaterally.  1+ soft pitting edema  Vessel Right Left  Radial Palpable Palpable  Dorsalis Pedis Palpable Palpable   Posterior Tibial Palpable Palpable   Gastrointestinal: soft, non-distended. No guarding/no peritoneal signs.  Musculoskeletal: M/S 5/5 throughout.  No deformity or atrophy.  Neurologic: Pain and light touch intact in extremities.  Symmetrical.  Speech is fluent. Motor exam as listed above. Psychiatric: Judgment intact, Mood & affect appropriate for pt's clinical situation. Dermatologic: No Venous rashes. No Ulcers Noted.  No changes consistent with cellulitis. Lymph : No Cervical lymphadenopathy, no lichenification or skin changes of chronic lymphedema.       ASSESSMENT AND PLAN:  1. Varicose veins of leg with pain, bilateral Recommend:  The patient has had successful ablation of the previously incompetent  saphenous venous system but still has persistent symptoms of pain and swelling that are having a negative impact on daily life and daily activities.  Patient should undergo injection sclerotherapy of the right lower extremity to treat the residual varicosities.She should also undergo foam sclerotherapy for treatment of the residual accessory saphenous vein.   The risks, benefits and alternative therapies were reviewed in detail with the patient.  All questions were answered.  The patient agrees to proceed with sclerotherapy at their convenience.  The patient will continue wearing the graduated compression stockings and using the over-the-counter pain medications to treat her symptoms.       2. Hyperlipidemia, unspecified hyperlipidemia type Continue statin as ordered and reviewed, no changes at this time   3. Essential hypertension Continue antihypertensive medications as already ordered, these medications have been reviewed and there are no changes at this time.    Current Outpatient Medications on File Prior to Visit  Medication Sig Dispense Refill  . ASHWAGANDHA PO Take by mouth daily.    . B Complex Vitamins (B COMPLEX PO) Take by mouth daily.    Marland Kitchen CALCIUM  CARB-CHOLECALCIFEROL PO Take by mouth daily.    . Cholecalciferol (VITAMIN D PO) Take by mouth daily.    Marland Kitchen GLUCOSAMINE-CHONDROITIN PO Take by mouth daily.    Marland Kitchen MILK THISTLE PO Take by mouth daily.    . Multiple Vitamin (MULTIVITAMIN) tablet Take 1 tablet by mouth daily.    . NON FORMULARY daily. Tru Niagen    . Omega-3 Fatty Acids (FISH OIL PO) Take by mouth daily.    . Probiotic Product (PROBIOTIC PO) Take by mouth daily.    . RABEprazole (ACIPHEX) 20 MG tablet Take 20 mg by mouth 2 (two) times daily.    . sertraline (ZOLOFT) 50 MG tablet     . TURMERIC PO Take by mouth daily.     No current facility-administered medications on file prior to visit.     There are no Patient Instructions on file for this visit. No follow-ups on file.   Kris Hartmann, NP  This note was completed with Sales executive.  Any errors are purely unintentional.

## 2018-08-31 ENCOUNTER — Ambulatory Visit (INDEPENDENT_AMBULATORY_CARE_PROVIDER_SITE_OTHER): Payer: BLUE CROSS/BLUE SHIELD | Admitting: Nurse Practitioner

## 2018-09-08 DIAGNOSIS — F331 Major depressive disorder, recurrent, moderate: Secondary | ICD-10-CM | POA: Diagnosis not present

## 2018-09-28 ENCOUNTER — Other Ambulatory Visit: Payer: Self-pay

## 2018-09-28 ENCOUNTER — Ambulatory Visit (INDEPENDENT_AMBULATORY_CARE_PROVIDER_SITE_OTHER): Payer: BC Managed Care – PPO | Admitting: Vascular Surgery

## 2018-09-28 ENCOUNTER — Encounter (INDEPENDENT_AMBULATORY_CARE_PROVIDER_SITE_OTHER): Payer: Self-pay | Admitting: Vascular Surgery

## 2018-09-28 VITALS — BP 139/86 | HR 94 | Resp 16 | Ht 67.0 in | Wt 212.6 lb

## 2018-09-28 DIAGNOSIS — I83813 Varicose veins of bilateral lower extremities with pain: Secondary | ICD-10-CM

## 2018-09-28 NOTE — Progress Notes (Signed)
Lynn Small is a 53 y.o.female who presents with painful varicose veins of the right leg  Past Medical History:  Diagnosis Date  . GERD (gastroesophageal reflux disease)   . Hypertension   . Wears contact lenses     Past Surgical History:  Procedure Laterality Date  . CHOLECYSTECTOMY    . COLONOSCOPY WITH PROPOFOL N/A 04/06/2017   Procedure: COLONOSCOPY WITH PROPOFOL;  Surgeon: Lucilla Lame, MD;  Location: [place];  Service: Endoscopy;  Laterality: N/A;  requests early  . POLYPECTOMY  04/06/2017   Procedure: POLYPECTOMY INTESTINAL;  Surgeon: Lucilla Lame, MD;  Location: [place];  Service: Endoscopy;;    Current Outpatient Medications  Medication Sig Dispense Refill  . ASHWAGANDHA PO Take by mouth daily.    . B Complex Vitamins (B COMPLEX PO) Take by mouth daily.    Marland Kitchen CALCIUM CARB-CHOLECALCIFEROL PO Take by mouth daily.    . Cholecalciferol (VITAMIN D PO) Take by mouth daily.    Marland Kitchen GLUCOSAMINE-CHONDROITIN PO Take by mouth daily.    Marland Kitchen MILK THISTLE PO Take by mouth daily.    . Multiple Vitamin (MULTIVITAMIN) tablet Take 1 tablet by mouth daily.    . NON FORMULARY daily. Tru Niagen    . Omega-3 Fatty Acids (FISH OIL PO) Take by mouth daily.    . Probiotic Product (PROBIOTIC PO) Take by mouth daily.    . RABEprazole (ACIPHEX) 20 MG tablet Take 20 mg by mouth 2 (two) times daily.    . sertraline (ZOLOFT) 50 MG tablet     . TURMERIC PO Take by mouth daily.     No current facility-administered medications for this visit.     No Known Allergies  Indication: Patient presents with symptomatic varicose veins of the right lower extremity.  Procedure: Foam sclerotherapy was performed on the right lower extremity. Using ultrasound guidance, 5 mL of foam Sotradecol was used to inject the varicosities of the right lower extremity. Compression wraps were placed. The patient tolerated the procedure well.

## 2018-10-12 ENCOUNTER — Ambulatory Visit (INDEPENDENT_AMBULATORY_CARE_PROVIDER_SITE_OTHER): Payer: BC Managed Care – PPO | Admitting: Vascular Surgery

## 2018-10-12 ENCOUNTER — Other Ambulatory Visit: Payer: Self-pay

## 2018-10-12 ENCOUNTER — Encounter (INDEPENDENT_AMBULATORY_CARE_PROVIDER_SITE_OTHER): Payer: Self-pay | Admitting: Vascular Surgery

## 2018-10-12 VITALS — BP 143/93 | HR 87 | Resp 10 | Ht 67.0 in | Wt 216.0 lb

## 2018-10-12 DIAGNOSIS — F4322 Adjustment disorder with anxiety: Secondary | ICD-10-CM | POA: Diagnosis not present

## 2018-10-12 DIAGNOSIS — F5105 Insomnia due to other mental disorder: Secondary | ICD-10-CM | POA: Diagnosis not present

## 2018-10-12 DIAGNOSIS — I83813 Varicose veins of bilateral lower extremities with pain: Secondary | ICD-10-CM | POA: Diagnosis not present

## 2018-10-12 DIAGNOSIS — F331 Major depressive disorder, recurrent, moderate: Secondary | ICD-10-CM | POA: Diagnosis not present

## 2018-10-12 NOTE — Progress Notes (Signed)
Lynn Small is a 53 y.o.female who presents with painful varicose veins of the right leg  Past Medical History:  Diagnosis Date  . GERD (gastroesophageal reflux disease)   . Hypertension   . Wears contact lenses     Past Surgical History:  Procedure Laterality Date  . CHOLECYSTECTOMY    . COLONOSCOPY WITH PROPOFOL N/A 04/06/2017   Procedure: COLONOSCOPY WITH PROPOFOL;  Surgeon: Lucilla Lame, MD;  Location: [place];  Service: Endoscopy;  Laterality: N/A;  requests early  . POLYPECTOMY  04/06/2017   Procedure: POLYPECTOMY INTESTINAL;  Surgeon: Lucilla Lame, MD;  Location: [place];  Service: Endoscopy;;    Current Outpatient Medications  Medication Sig Dispense Refill  . ASHWAGANDHA PO Take by mouth daily.    . B Complex Vitamins (B COMPLEX PO) Take by mouth daily.    Marland Kitchen CALCIUM CARB-CHOLECALCIFEROL PO Take by mouth daily.    . Cholecalciferol (VITAMIN D PO) Take by mouth daily.    Marland Kitchen GLUCOSAMINE-CHONDROITIN PO Take by mouth daily.    Marland Kitchen MILK THISTLE PO Take by mouth daily.    . Multiple Vitamin (MULTIVITAMIN) tablet Take 1 tablet by mouth daily.    . NON FORMULARY daily. Tru Niagen    . Omega-3 Fatty Acids (FISH OIL PO) Take by mouth daily.    . Probiotic Product (PROBIOTIC PO) Take by mouth daily.    . RABEprazole (ACIPHEX) 20 MG tablet Take 20 mg by mouth 2 (two) times daily.    . sertraline (ZOLOFT) 50 MG tablet     . TURMERIC PO Take by mouth daily.     No current facility-administered medications for this visit.     No Known Allergies  Indication: Patient presents with symptomatic varicose veins of the right lower extremity.  Procedure: Foam sclerotherapy was performed on the right lower extremity. Using ultrasound guidance, 5 mL of foam Sotradecol was used to inject the varicosities of the right lower extremity. Compression wraps were placed. The patient tolerated the procedure well.

## 2018-10-15 DIAGNOSIS — F331 Major depressive disorder, recurrent, moderate: Secondary | ICD-10-CM | POA: Diagnosis not present

## 2018-11-02 ENCOUNTER — Ambulatory Visit (INDEPENDENT_AMBULATORY_CARE_PROVIDER_SITE_OTHER): Payer: BC Managed Care – PPO | Admitting: Vascular Surgery

## 2018-11-02 ENCOUNTER — Encounter (INDEPENDENT_AMBULATORY_CARE_PROVIDER_SITE_OTHER): Payer: Self-pay | Admitting: Vascular Surgery

## 2018-11-02 ENCOUNTER — Other Ambulatory Visit: Payer: Self-pay

## 2018-11-02 DIAGNOSIS — F329 Major depressive disorder, single episode, unspecified: Secondary | ICD-10-CM | POA: Insufficient documentation

## 2018-11-02 DIAGNOSIS — F32A Depression, unspecified: Secondary | ICD-10-CM | POA: Insufficient documentation

## 2018-11-02 DIAGNOSIS — I83811 Varicose veins of right lower extremities with pain: Secondary | ICD-10-CM | POA: Diagnosis not present

## 2018-11-02 DIAGNOSIS — I83813 Varicose veins of bilateral lower extremities with pain: Secondary | ICD-10-CM

## 2018-11-02 NOTE — Progress Notes (Signed)
Lynn Small is a 53 y.o.female who presents with painful varicose veins of the right leg  Past Medical History:  Diagnosis Date  . GERD (gastroesophageal reflux disease)   . Hypertension   . Wears contact lenses     Past Surgical History:  Procedure Laterality Date  . CHOLECYSTECTOMY    . COLONOSCOPY WITH PROPOFOL N/A 04/06/2017   Procedure: COLONOSCOPY WITH PROPOFOL;  Surgeon: Lucilla Lame, MD;  Location: [place];  Service: Endoscopy;  Laterality: N/A;  requests early  . POLYPECTOMY  04/06/2017   Procedure: POLYPECTOMY INTESTINAL;  Surgeon: Lucilla Lame, MD;  Location: [place];  Service: Endoscopy;;    Current Outpatient Medications  Medication Sig Dispense Refill  . ASHWAGANDHA PO Take by mouth daily.    . B Complex Vitamins (B COMPLEX PO) Take by mouth daily.    Marland Kitchen CALCIUM CARB-CHOLECALCIFEROL PO Take by mouth daily.    . Cholecalciferol (VITAMIN D PO) Take by mouth daily.    Marland Kitchen GLUCOSAMINE-CHONDROITIN PO Take by mouth daily.    Marland Kitchen MILK THISTLE PO Take by mouth daily.    . Multiple Vitamin (MULTIVITAMIN) tablet Take 1 tablet by mouth daily.    . NON FORMULARY daily. Tru Niagen    . Omega-3 Fatty Acids (FISH OIL PO) Take by mouth daily.    . Probiotic Product (PROBIOTIC PO) Take by mouth daily.    . RABEprazole (ACIPHEX) 20 MG tablet Take 20 mg by mouth 2 (two) times daily.    . sertraline (ZOLOFT) 50 MG tablet     . TURMERIC PO Take by mouth daily.     No current facility-administered medications for this visit.     No Known Allergies  Indication: Patient presents with symptomatic varicose veins of the right lower extremity.  Procedure: Foam sclerotherapy was performed on the right lower extremity. Using ultrasound guidance, 5 mL of foam Sotradecol was used to inject the varicosities of the right lower extremity. Compression wraps were placed. The patient tolerated the procedure well.

## 2018-11-16 ENCOUNTER — Telehealth: Payer: Self-pay | Admitting: *Deleted

## 2018-11-16 NOTE — Telephone Encounter (Signed)
Lynn Small called [facility] with c/o "sore throat" for 2 days. Denies any other symptoms except "don't feel good". Denies fever. 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 Advised pt to call her PCP Dr.Sparks and seek his advice about symptoms and testing as they do have Covid testing available at [facility]. Lynn Small verbalizes understanding of topics discussed and has no further needs or concerns at this time.

## 2018-11-26 ENCOUNTER — Ambulatory Visit (INDEPENDENT_AMBULATORY_CARE_PROVIDER_SITE_OTHER): Payer: BC Managed Care – PPO | Admitting: Vascular Surgery

## 2018-12-15 ENCOUNTER — Ambulatory Visit: Payer: BC Managed Care – PPO

## 2018-12-17 ENCOUNTER — Ambulatory Visit (INDEPENDENT_AMBULATORY_CARE_PROVIDER_SITE_OTHER): Payer: BC Managed Care – PPO | Admitting: Vascular Surgery

## 2018-12-17 ENCOUNTER — Other Ambulatory Visit: Payer: Self-pay

## 2018-12-17 VITALS — BP 133/86 | HR 82 | Resp 16 | Ht 66.0 in | Wt 219.0 lb

## 2018-12-17 DIAGNOSIS — I83813 Varicose veins of bilateral lower extremities with pain: Secondary | ICD-10-CM

## 2018-12-17 NOTE — Progress Notes (Signed)
Patient came for foam sclerotherapy today.  On evaluation, ultrasound showed residual varicosities were quite small and not really appropriate for foam sclerotherapy at this time.  Patient would like conventional sclerotherapy for her smaller varicosities and this will be scheduled at a later time with a longer appointment time.  No intervention done today.

## 2019-01-05 ENCOUNTER — Ambulatory Visit (INDEPENDENT_AMBULATORY_CARE_PROVIDER_SITE_OTHER): Payer: BC Managed Care – PPO | Admitting: Vascular Surgery

## 2019-01-05 ENCOUNTER — Encounter (INDEPENDENT_AMBULATORY_CARE_PROVIDER_SITE_OTHER): Payer: Self-pay | Admitting: Vascular Surgery

## 2019-01-05 ENCOUNTER — Other Ambulatory Visit: Payer: Self-pay

## 2019-01-05 VITALS — BP 148/90 | HR 91 | Resp 16 | Wt 217.0 lb

## 2019-01-05 DIAGNOSIS — I83813 Varicose veins of bilateral lower extremities with pain: Secondary | ICD-10-CM

## 2019-01-05 DIAGNOSIS — I83811 Varicose veins of right lower extremities with pain: Secondary | ICD-10-CM

## 2019-01-05 NOTE — Progress Notes (Signed)
Varicose veins of right  lower extremity with inflammation (454.1  I83.10) Current Plans   Indication: Patient presents with symptomatic varicose veins of the right  lower extremity.   Procedure: Sclerotherapy using hypertonic saline mixed with 1% Lidocaine was performed on the right lower extremity. Compression wraps were placed. The patient tolerated the procedure well. 

## 2019-02-09 ENCOUNTER — Ambulatory Visit (INDEPENDENT_AMBULATORY_CARE_PROVIDER_SITE_OTHER): Payer: BC Managed Care – PPO | Admitting: Nurse Practitioner

## 2019-02-09 ENCOUNTER — Other Ambulatory Visit: Payer: Self-pay

## 2019-02-09 ENCOUNTER — Encounter (INDEPENDENT_AMBULATORY_CARE_PROVIDER_SITE_OTHER): Payer: Self-pay | Admitting: Nurse Practitioner

## 2019-02-09 VITALS — BP 121/80 | HR 97 | Resp 16 | Wt 216.0 lb

## 2019-02-09 DIAGNOSIS — I83813 Varicose veins of bilateral lower extremities with pain: Secondary | ICD-10-CM

## 2019-02-09 NOTE — Progress Notes (Signed)
Varicose veins of right  lower extremity with inflammation (454.1  I83.10) Current Plans   Indication: Patient presents with symptomatic varicose veins of the right  lower extremity.   Procedure: Sclerotherapy using hypertonic saline mixed with 1% Lidocaine was performed on the right lower extremity. Compression wraps were placed. The patient tolerated the procedure well. 

## 2019-03-09 ENCOUNTER — Ambulatory Visit (INDEPENDENT_AMBULATORY_CARE_PROVIDER_SITE_OTHER): Payer: BC Managed Care – PPO | Admitting: Nurse Practitioner

## 2019-04-27 ENCOUNTER — Other Ambulatory Visit: Payer: Self-pay | Admitting: Internal Medicine

## 2019-04-27 DIAGNOSIS — Z1231 Encounter for screening mammogram for malignant neoplasm of breast: Secondary | ICD-10-CM

## 2019-06-05 ENCOUNTER — Ambulatory Visit: Payer: BLUE CROSS/BLUE SHIELD | Attending: Internal Medicine

## 2019-06-05 DIAGNOSIS — Z23 Encounter for immunization: Secondary | ICD-10-CM

## 2019-06-05 NOTE — Progress Notes (Signed)
   Covid-19 Vaccination Clinic  Name:  AZAYLAH APPEL    MRN: [id number] DOB: 09-28-1965  06/05/2019  Ms. Bernardy was observed post Covid-19 immunization for 15 minutes without incident. She was provided with Vaccine Information Sheet and instruction to access the V-Safe system.   Ms. Cassella was instructed to call 911 with any severe reactions post vaccine: Marland Kitchen Difficulty breathing  . Swelling of face and throat  . A fast heartbeat  . A bad rash all over body  . Dizziness and weakness   Immunizations Administered    Name Date Dose VIS Date Route   Pfizer COVID-19 Vaccine 06/05/2019  5:22 PM 0.3 mL 02/25/2019 Intramuscular   Manufacturer: [place]   Lot: [id number]   [place]: [id number]

## 2019-06-26 ENCOUNTER — Ambulatory Visit: Payer: Self-pay | Attending: Internal Medicine

## 2019-06-26 DIAGNOSIS — Z23 Encounter for immunization: Secondary | ICD-10-CM

## 2019-06-26 NOTE — Progress Notes (Signed)
   Covid-19 Vaccination Clinic  Name:  Lynn Small    MRN: [id number] DOB: 1965/07/18  06/26/2019  Lynn Small was observed post Covid-19 immunization for 15 minutes without incident. She was provided with Vaccine Information Sheet and instruction to access the V-Safe system.   Lynn Small was instructed to call 911 with any severe reactions post vaccine: Marland Kitchen Difficulty breathing  . Swelling of face and throat  . A fast heartbeat  . A bad rash all over body  . Dizziness and weakness   Immunizations Administered    Name Date Dose VIS Date Route   Pfizer COVID-19 Vaccine 06/26/2019  4:33 PM 0.3 mL 02/25/2019 Intramuscular   Manufacturer: [place]   Lot: [id number]   [place]: [id number]

## 2019-07-19 ENCOUNTER — Ambulatory Visit: Payer: BC Managed Care – PPO | Admitting: Medical

## 2019-07-19 ENCOUNTER — Encounter: Payer: Self-pay | Admitting: Medical

## 2019-07-19 ENCOUNTER — Other Ambulatory Visit: Payer: Self-pay

## 2019-07-19 VITALS — BP 145/88 | HR 102 | Temp 99.2°F | Resp 16 | Wt 220.6 lb

## 2019-07-19 DIAGNOSIS — W57XXXA Bitten or stung by nonvenomous insect and other nonvenomous arthropods, initial encounter: Secondary | ICD-10-CM

## 2019-07-19 DIAGNOSIS — L03116 Cellulitis of left lower limb: Secondary | ICD-10-CM

## 2019-07-19 MED ORDER — DOXYCYCLINE HYCLATE 100 MG PO TABS: 100.0000 mg | ORAL_TABLET | Freq: Two times a day (BID) | ORAL | 0 refills | Status: DC

## 2019-07-19 NOTE — Progress Notes (Signed)
Subjective:    Patient ID: UVA BORSETH, female    DOB: 1965-11-05, 54 y.o.   MRN: [id number]  HPI 54 yo female in non acute distress. Presents with what she thinks is a spider bite to her ;left lower leg noticed it on Friday night, had itching at site. Headache ( took Excedrin)  on Saturday, not uncommon due to a lot of computer usage she tells RN. However she thought it was strange she had a headache.  Not on blood pressure medication intially treated and then seemed to improve was then  taken off medication. Teaches Statistics x 22 years.  Pailevel now is 4/10. Has been teaching all day today on her feet.  Painful > itchy now.   Blood pressure (!) 145/88, pulse (!) 102, temperature 99.2 F (37.3 C), temperature source Tympanic, resp. rate 16, weight 220 lb 9.6 oz (100.1 kg), SpO2 97 %.  No Known Allergies   Current Outpatient Medications:  .  aspirin 81 MG EC tablet, Take 1 tablet by mouth daily., Disp: , Rfl:  .  B Complex Vitamins (B COMPLEX PO), Take by mouth daily., Disp: , Rfl:  .  CALCIUM CARB-CHOLECALCIFEROL PO, Take by mouth daily., Disp: , Rfl:  .  Cholecalciferol (VITAMIN D PO), Take by mouth daily., Disp: , Rfl:  .  GLUCOSAMINE-CHONDROITIN PO, Take by mouth daily., Disp: , Rfl:  .  MILK THISTLE PO, Take by mouth daily., Disp: , Rfl:  .  Multiple Vitamin (MULTIVITAMIN) tablet, Take 1 tablet by mouth daily., Disp: , Rfl:  .  NON FORMULARY, daily. Tru Niagen, Disp: , Rfl:  .  Omega-3 Fatty Acids (FISH OIL PO), Take by mouth daily., Disp: , Rfl:  .  Probiotic Product (PROBIOTIC PO), Take by mouth daily., Disp: , Rfl:  .  RABEprazole (ACIPHEX) 20 MG tablet, Take 20 mg by mouth 2 (two) times daily., Disp: , Rfl:  .  rosuvastatin (CRESTOR) 5 MG tablet, Take 5 mg by mouth daily., Disp: , Rfl:  .  sertraline (ZOLOFT) 50 MG tablet, , Disp: , Rfl:  .  TURMERIC PO, Take by mouth daily., Disp: , Rfl:  .  doxycycline (VIBRA-TABS) 100 MG tablet, Take 1 tablet (100 mg total) by  mouth 2 (two) times daily., Disp: 20 tablet, Rfl: 0  Review of Systems  Constitutional: Negative for chills and fever.  Skin: Positive for color change (left lower leg) and wound.  Neurological: Positive for headaches (saturday and sunday none now).  Psychiatric/Behavioral: Positive for agitation.  All other systems reviewed and are negative.      Objective:   Physical Exam Skin:        No inguinal adenopathy. Increased redness around bite area which is crusted ( approximately a quarter sized area, indurated. Lacey red skin  below bite area. No redlines or streaks. Swelling from site into proximal  ankle area , nonpitting, no swelling or redness into dorum of foot or toes.Marland Kitchentender to palpation.  Gait wnl        Assessment & Plan:  Insect bite with cellulitus on left lower posterior leg.  Return 2 day recheck  Sooner if any concerns,  Like fever increased redness or a streak/line going up leg.  Clean twice daily with an antibacterial soap and neosporin to the side and a bandage.  Warm and cool  Compress. Elevate 45 degree ankle.  Ibuprofen  800 mg 3 times a day with food.She chooses to use her own Ibuprofen 200 mg /tablet.  Meds  ordered this encounter  Medications  . doxycycline (VIBRA-TABS) 100 MG tablet    Sig: Take 1 tablet (100 mg total) by mouth 2 (two) times daily.    Dispense:  20 tablet    Refill:  0  .  Patient verbalizes understanding , I forgot to give AVS so I sent it thorugh my Chart.  She verbalizes understanding and has no questions at discharge.

## 2019-07-19 NOTE — Patient Instructions (Signed)
Cellulitis, Adult  Cellulitis is a skin infection. The infected area is often warm, red, swollen, and sore. It occurs most often in the arms and lower legs. It is very important to get treated for this condition. What are the causes? This condition is caused by bacteria. The bacteria enter through a break in the skin, such as a cut, burn, insect bite, open sore, or crack. What increases the risk? This condition is more likely to occur in people who:  Have a weak body defense system (immune system).  Have open cuts, burns, bites, or scrapes on the skin.  Are older than 54 years of age.  Have a blood sugar problem (diabetes).  Have a long-lasting (chronic) liver disease (cirrhosis) or kidney disease.  Are very overweight (obese).  Have a skin problem, such as: ? Itchy rash (eczema). ? Slow movement of blood in the veins (venous stasis). ? Fluid buildup below the skin (edema).  Have been treated with high-energy rays (radiation).  Use IV drugs. What are the signs or symptoms? Symptoms of this condition include:  Skin that is: ? Red. ? Streaking. ? Spotting. ? Swollen. ? Sore or painful when you touch it. ? Warm.  A fever.  Chills.  Blisters. How is this diagnosed? This condition is diagnosed based on:  Medical history.  Physical exam.  Blood tests.  Imaging tests. How is this treated? Treatment for this condition may include:  Medicines to treat infections or allergies.  Home care, such as: ? Rest. ? Placing cold or warm cloths (compresses) on the skin.  Hospital care, if the condition is very bad. Follow these instructions at home: Medicines  Take over-the-counter and prescription medicines only as told by your doctor.  If you were prescribed an antibiotic medicine, take it as told by your doctor. Do not stop taking it even if you start to feel better. General instructions   Drink enough fluid to keep your pee (urine) pale yellow.  Do not touch  or rub the infected area.  Raise (elevate) the infected area above the level of your heart while you are sitting or lying down.  Place cold or warm cloths on the area as told by your doctor.  Keep all follow-up visits as told by your doctor. This is important. Contact a doctor if:  You have a fever.  You do not start to get better after 1-2 days of treatment.  Your bone or joint under the infected area starts to hurt after the skin has healed.  Your infection comes back. This can happen in the same area or another area.  You have a swollen bump in the area.  You have new symptoms.  You feel ill and have muscle aches and pains. Get help right away if:  Your symptoms get worse.  You feel very sleepy.  You throw up (vomit) or have watery poop (diarrhea) for a long time.  You see red streaks coming from the area.  Your red area gets larger.  Your red area turns dark in color. These symptoms may represent a serious problem that is an emergency. Do not wait to see if the symptoms will go away. Get medical help right away. Call your local emergency services (911 in the [place] Do not drive yourself to the hospital. Summary  Cellulitis is a skin infection. The area is often warm, red, swollen, and sore.  This condition is treated with medicines, rest, and cold and warm cloths.  Take all medicines only   as told by your doctor.  Tell your doctor if symptoms do not start to get better after 1-2 days of treatment. This information is not intended to replace advice given to you by your health care provider. Make sure you discuss any questions you have with your health care provider. Document Revised: 07/23/2017 Document Reviewed: 07/23/2017 Elsevier Patient Education  [place], Adult An insect bite can make your skin red, itchy, and swollen. Some insects can spread disease to people with a bite. However, most insect bites do not lead to disease, and most are  not serious. What are the causes? Insects may bite for many reasons, including:  Hunger.  To defend themselves. Insects that bite include:  Spiders.  Mosquitoes.  Ticks.  Fleas.  Ants.  Flies.  Kissing bugs.  Chiggers. What are the signs or symptoms? Symptoms of this condition include:  Itching or pain in the bite area.  Redness and swelling in the bite area.  An open wound (skin ulcer). Symptoms often last for 2-4 days. In rare cases, a person may have a very bad allergic reaction (anaphylactic reaction) to a bite. Symptoms of an anaphylactic reaction may include:  Feeling warm in the face (flushed). Your face may turn red.  Itchy, red, swollen areas of skin (hives).  Swelling of the: ? Eyes. ? Lips. ? Face. ? Mouth. ? Tongue. ? Throat.  Trouble with any of these: ? Breathing. ? Talking. ? Swallowing.  Loud breathing (wheezing).  Feeling dizzy or light-headed.  Passing out (fainting).  Pain or cramps in your belly.  Throwing up (vomiting).  Watery poop (diarrhea). How is this treated? Treatment is usually not needed. Symptoms often go away on their own. When treatment is needed, it may involve:  Putting a cream or lotion on the bite area. This helps with itching.  Taking an antibiotic medicine. This treatment is needed if the bite area gets infected.  Getting a tetanus shot, if you are not up to date on this vaccine.  Putting ice on the affected area.  Using medicines called antihistamines. This treatment may be needed if you have itching or an allergic reaction to the insect bite.  Giving yourself a shot of medicine (epinephrine) using an auto-injector "pen" if you have an anaphylactic reaction to a bite. Your doctor will teach you how to use this pen. Follow these instructions at home: Bite area care   Do not scratch the bite area.  Keep the bite area clean and dry.  Wash the bite area every day with soap and water as told by  your doctor.  Check the bite area every day for signs of infection. Check for: ? Redness, swelling, or pain. ? Fluid or blood. ? Warmth. ? Pus or a bad smell. Managing pain, itching, and swelling   You may put any of these on the bite area as told by your doctor: ? A paste made of baking soda and water. ? Cortisone cream. ? Calamine lotion.  If told, put ice on the bite area. ? Put ice in a plastic bag. ? Place a towel between your skin and the bag. ? Leave the ice on for 20 minutes, 2-3 times a day. General instructions  Apply or take over-the-counter and prescription medicines only as told by your doctor.  If you were prescribed an antibiotic medicine, take or apply it as told by your doctor. Do not stop using the antibiotic even if your condition improves.  Keep all  follow-up visits as told by your doctor. This is important. How is this prevented? To help you have a lower risk of insect bites:  When you are outside, wear clothing that covers your arms and legs.  Use insect repellent. The best insect repellents contain one of these: ? DEET. ? Picaridin. ? Oil of lemon eucalyptus (OLE). ? [id number].  Consider spraying your clothing with a pesticide called permethrin. Permethrin helps prevent insect bites. It works for several weeks and for up to 5-6 clothing washes. Do not apply permethrin directly to the skin.  If your home windows do not have screens, think about putting some in.  If you will be sleeping in an area where there are mosquitoes, consider covering your sleeping area with a mosquito net. Contact a doctor if:  You have redness, swelling, or pain in the bite area.  You have fluid or blood coming from the bite area.  The bite area feels warm to the touch.  You have pus or a bad smell coming from the bite area.  You have a fever. Get help right away if:  You have joint pain.  You have a rash.  You feel more tired or sleepy than you normally  do.  You have neck pain.  You have a headache.  You feel weaker than you normally do.  You have signs of an anaphylactic reaction. Signs may include: ? Feeling warm in the face. ? Itchy, red, swollen areas of skin. ? Swelling of your:  Eyes.  Lips.  Face.  Mouth.  Tongue.  Throat. ? Trouble with any of these:  Breathing.  Talking.  Swallowing. ? Loud breathing. ? Feeling dizzy or light-headed. ? Passing out. ? Pain or cramps in your belly. ? Throwing up. ? Watery poop. These symptoms may be an emergency. Do not wait to see if the symptoms will go away. Do this right away:  Use your auto-injector pen as you have been told.  Get medical help. Call your local emergency services (911 in the [place] Do not drive yourself to the hospital. Summary  An insect bite can make your skin red, itchy, and swollen.  Treatment is usually not needed. Symptoms often go away on their own.  Do not scratch the bite area. Keep it clean and dry.  Ice can help with pain and itching from the bite. This information is not intended to replace advice given to you by your health care provider. Make sure you discuss any questions you have with your health care provider. Document Revised: 09/11/2017 Document Reviewed: 09/11/2017 Elsevier Patient Education  [place].

## 2019-07-21 ENCOUNTER — Ambulatory Visit: Payer: Self-pay | Admitting: Nurse Practitioner

## 2019-07-21 ENCOUNTER — Encounter: Payer: Self-pay | Admitting: Nurse Practitioner

## 2019-07-21 ENCOUNTER — Other Ambulatory Visit: Payer: Self-pay

## 2019-07-21 VITALS — BP 156/86 | HR 90 | Temp 97.9°F | Resp 18 | Ht 67.0 in | Wt 226.0 lb

## 2019-07-21 DIAGNOSIS — W57XXXD Bitten or stung by nonvenomous insect and other nonvenomous arthropods, subsequent encounter: Secondary | ICD-10-CM

## 2019-07-21 DIAGNOSIS — T3695XA Adverse effect of unspecified systemic antibiotic, initial encounter: Secondary | ICD-10-CM

## 2019-07-21 DIAGNOSIS — L03116 Cellulitis of left lower limb: Secondary | ICD-10-CM

## 2019-07-21 DIAGNOSIS — S80862D Insect bite (nonvenomous), left lower leg, subsequent encounter: Secondary | ICD-10-CM

## 2019-07-21 DIAGNOSIS — B379 Candidiasis, unspecified: Secondary | ICD-10-CM

## 2019-07-21 MED ORDER — FLUCONAZOLE 150 MG PO TABS: 150.0000 mg | ORAL_TABLET | Freq: Once | ORAL | 0 refills | Status: AC

## 2019-07-21 NOTE — Patient Instructions (Addendum)
Andi, please try to elevate your left leg as much as possible and when you're doing a lot of walking wear the ace wrap during the day to help with the swelling. However, as the infection improves with the antibitic and wearing the ace wrap the swelling will improve/resolve. Continue the antibiotics as directed and keep area clean and open to allow it to heal. If you develop increasing redness, swelling not resolved, red lines up your leg, fever or discolored drainage out of the area please call or schedule an appointment to the clinic right away Return to the clinic as needed

## 2019-07-21 NOTE — Progress Notes (Signed)
   Subjective:    Patient ID: Lynn Small, female    DOB: August 17, 1965, 54 y.o.   MRN: [id number]  HPI Lynn Small is here for f/u today on a suspected spider bit that occurred around 4/30. She reports the redness and swelling is much better. She admits she usually gets a yeast infection when on antibiotics but no vaginal itching or complaints currently. She reports she has been on her feet most of the day finishing up the semester although she reports her swelling and redness has improved. She denies any pain, itching, burning or fever today.   Review of Systems  Cardiovascular: Positive for leg swelling.       Left  Skin: Positive for wound.       Objective:   Physical Exam Constitutional:      General: She is not in acute distress.    Appearance: Normal appearance. She is not ill-appearing.  HENT:     Head: Normocephalic and atraumatic.  Cardiovascular:     Comments: Left pedal 2+ with 3+ pitting edema to left lower extemity. Skin:    General: Skin is warm and dry.     Findings: Erythema and lesion present.     Comments: Mild erythema around lesion area with no drainage or odor noted.  Neurological:     General: No focal deficit present.     Mental Status: She is alert and oriented to person, place, and time.  Psychiatric:        Mood and Affect: Mood normal.        Behavior: Behavior normal.           Assessment & Plan:  Cellulitis with insect bite of left lower leg improving. Still moderate amount of edema and demonstrated use of ace wrap today to help reduce swelling and to wear when she's on her feet which she can remove in the evening. Wound was left exposed and she is to keep dry and clean and open to air to allow healing.  Encouraged to elevate leg as much as possible. To monitor for worsening symptoms.

## 2019-11-03 IMAGING — MG MM DIGITAL SCREENING BILAT W/ CAD
4 series · 4 of 4 positions shown · non-contrast
Comparison: Previous exam(s).

ACR Breast Density Category a: The breast tissue is almost entirely
fatty.

CLINICAL DATA: Screening.

EXAM:
DIGITAL SCREENING BILATERAL MAMMOGRAM WITH CAD

[R CC]
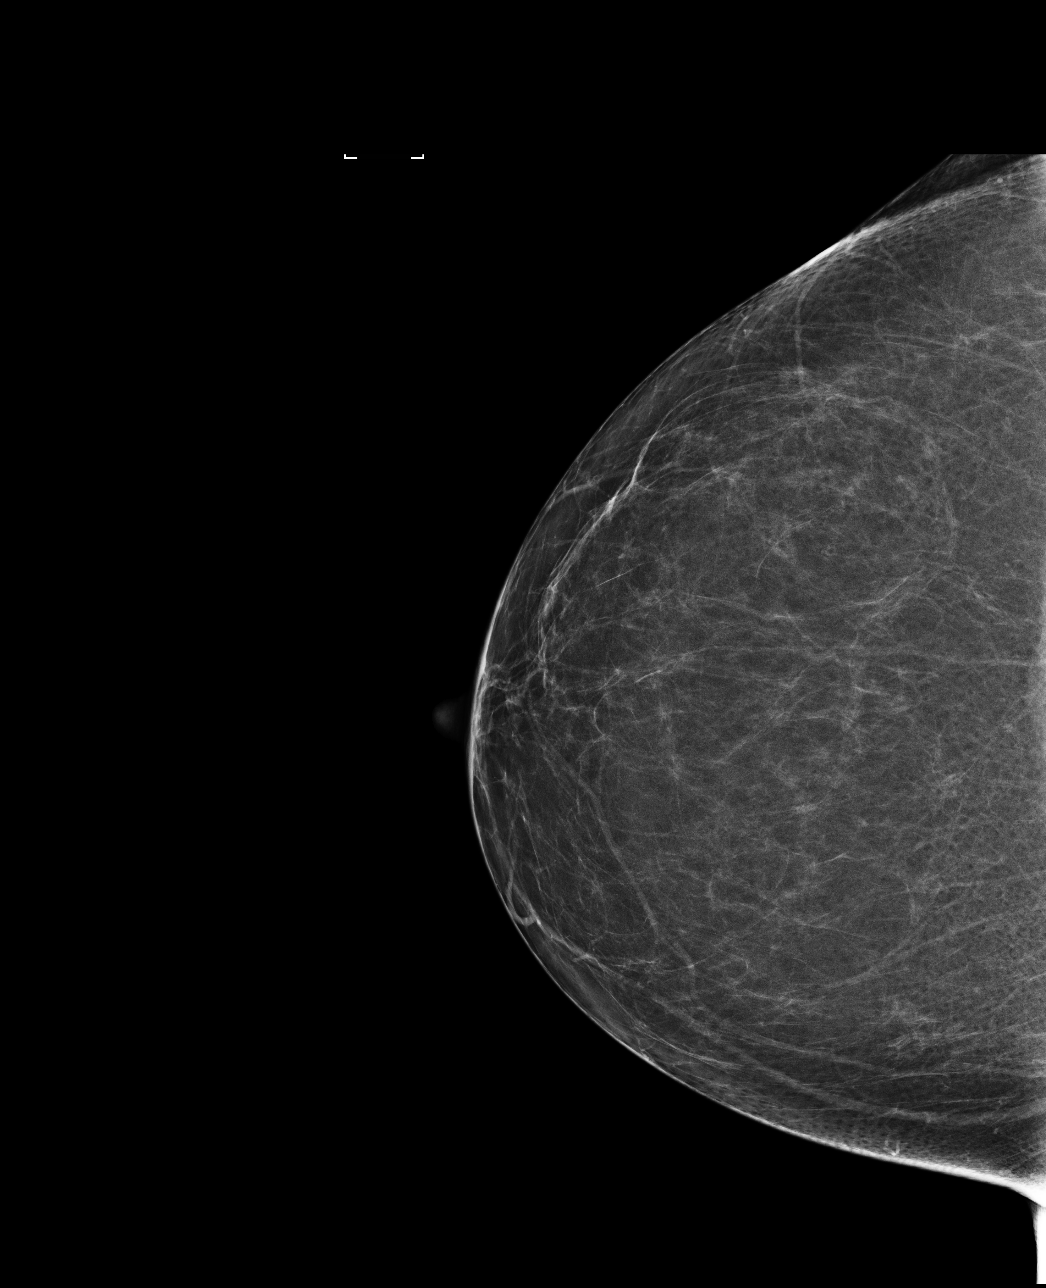

[L CC]
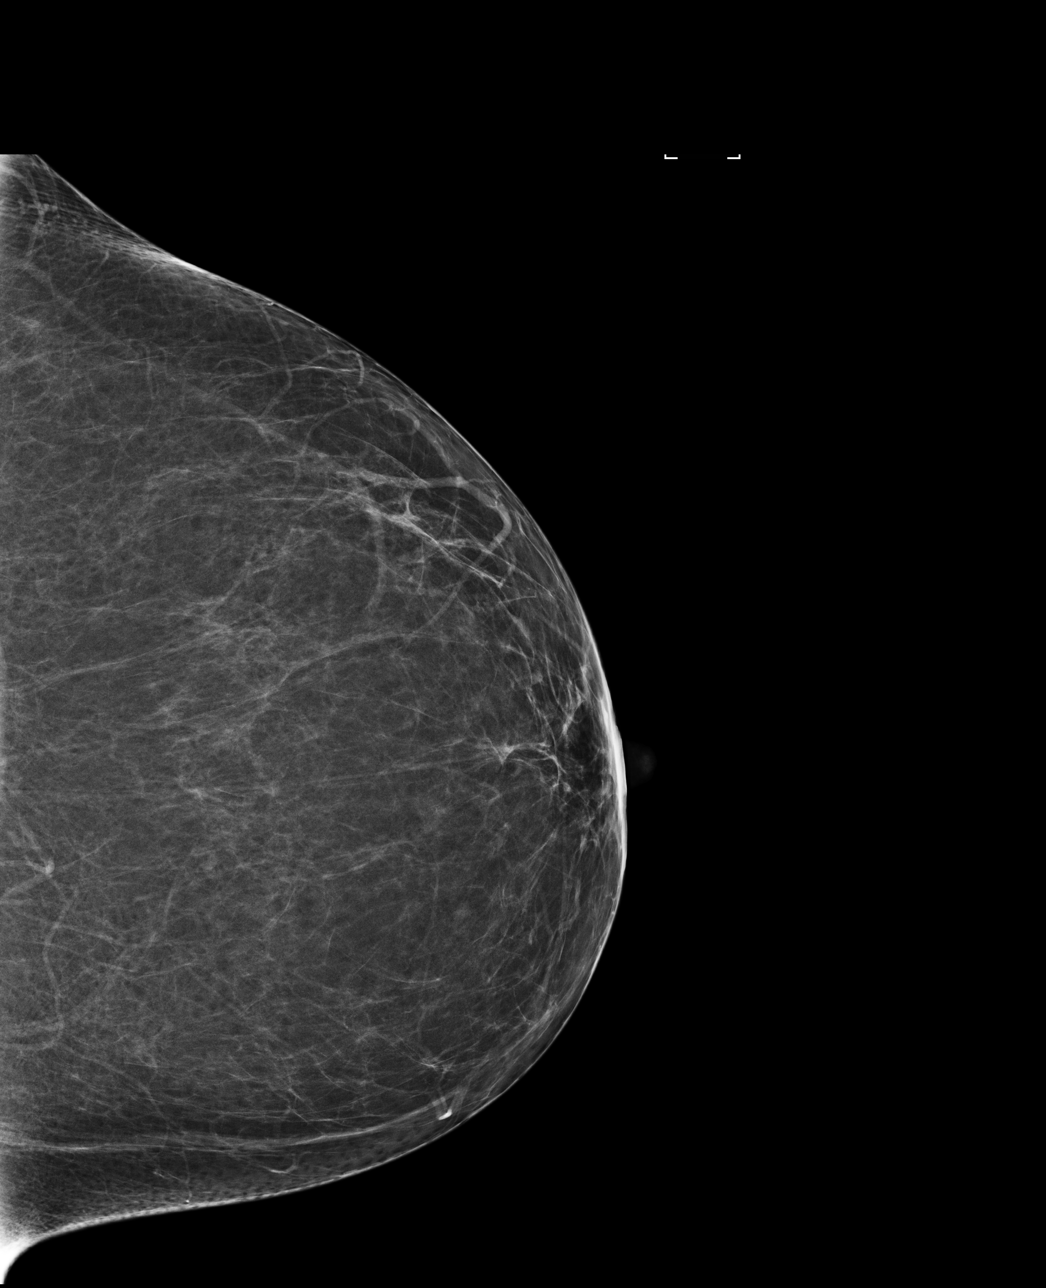

[R MLO]
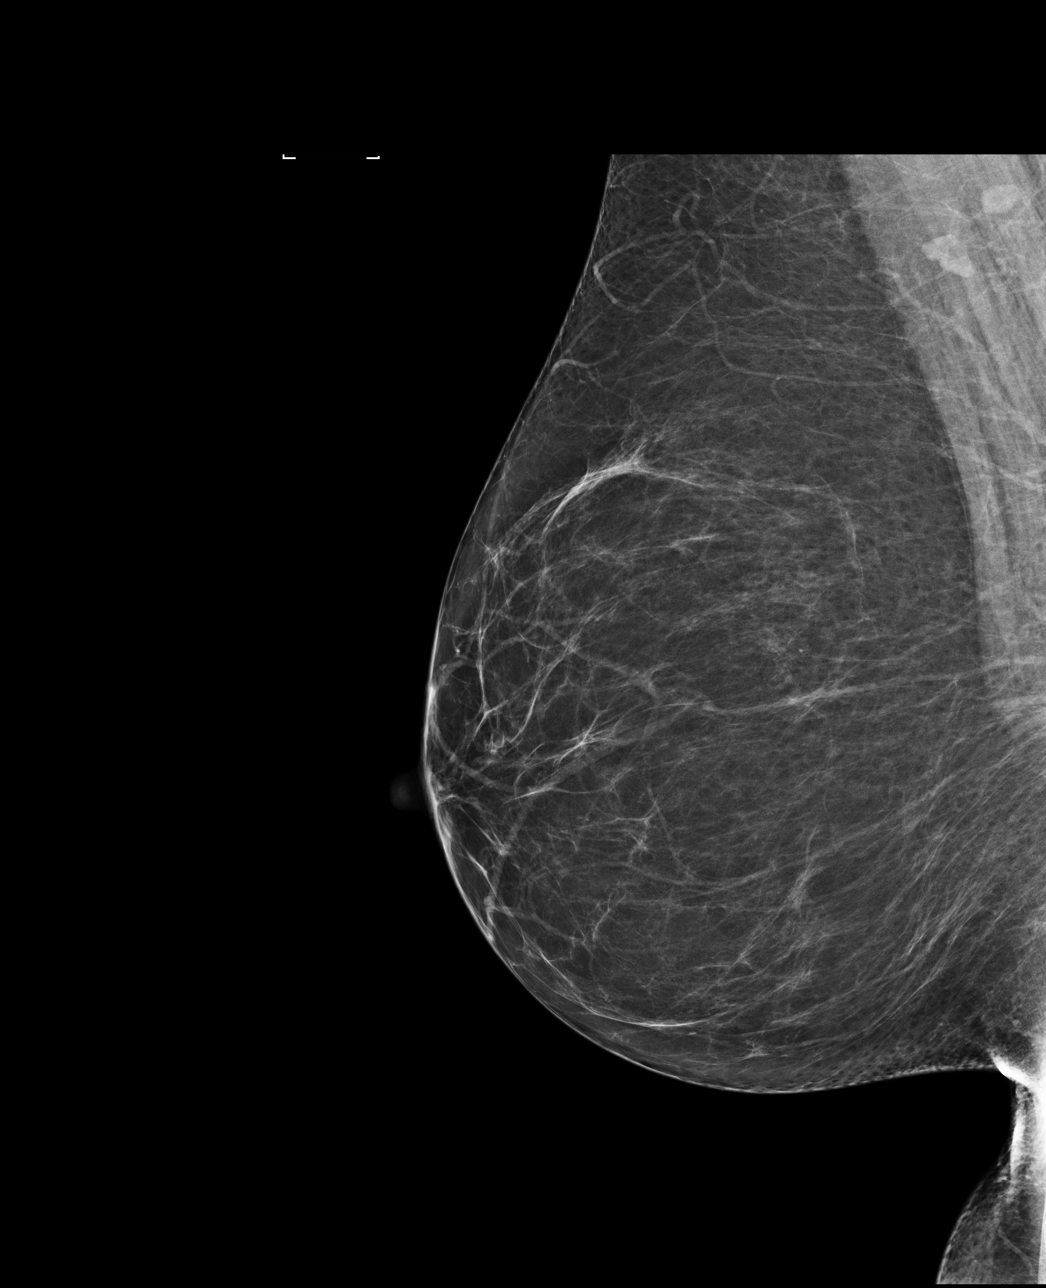

[L MLO]
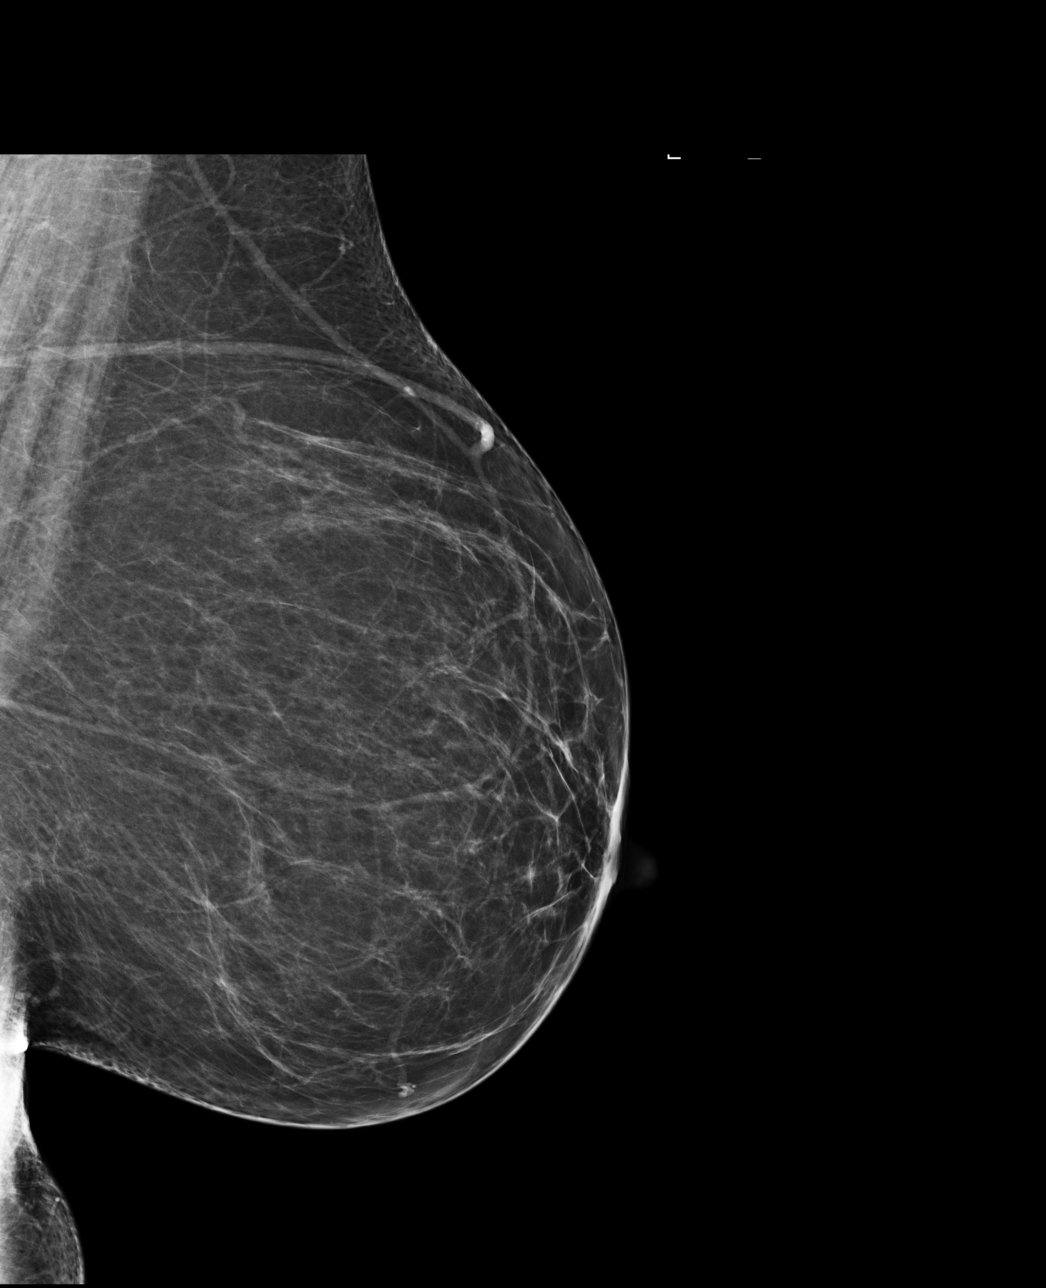

[4 of 4 positions shown; findings below may reference images not displayed]

FINDINGS: There are no findings suspicious for malignancy. Images were
processed with CAD.
IMPRESSION: No mammographic evidence of malignancy. A result letter of this
screening mammogram will be mailed directly to the patient.

RECOMMENDATION:
Screening mammogram in one year. (Code:[id number])

BI-RADS CATEGORY  1: Negative.

## 2019-11-16 ENCOUNTER — Telehealth: Payer: Self-pay

## 2019-11-16 NOTE — Telephone Encounter (Signed)
Contacted patient for lung CT screening clinic after receiving referral from Dr. Doy Hutching. I explained program to patient and she is agreeable to participate.  She is a former smoker who stopped smoking on October 14 2016.  She started smoking in college at age 54. When she smoked she smoked 1 pack a day and sometimes less than 25% of a second pack.  CT scan appointment made for Sept 22 at 10:15.  Address of imaging center and phone number to Lynn Small, lung navigator given to patient.  Patient was thankful for the call.

## 2019-11-29 ENCOUNTER — Other Ambulatory Visit: Payer: Self-pay | Admitting: *Deleted

## 2019-11-29 DIAGNOSIS — Z122 Encounter for screening for malignant neoplasm of respiratory organs: Secondary | ICD-10-CM

## 2019-11-29 DIAGNOSIS — Z87891 Personal history of nicotine dependence: Secondary | ICD-10-CM

## 2019-11-29 NOTE — Progress Notes (Signed)
Former smoker, quit 10/14/16, 30 pack year

## 2019-12-07 ENCOUNTER — Inpatient Hospital Stay: Payer: BC Managed Care – PPO | Attending: Oncology | Admitting: Oncology

## 2019-12-07 ENCOUNTER — Other Ambulatory Visit: Payer: Self-pay

## 2019-12-07 ENCOUNTER — Encounter: Payer: Self-pay | Admitting: Oncology

## 2019-12-07 ENCOUNTER — Ambulatory Visit
Admission: RE | Admit: 2019-12-07 | Discharge: 2019-12-07 | Disposition: A | Discharge status: Home / Self Care | Payer: BC Managed Care – PPO | Source: Ambulatory Visit | Attending: Oncology | Admitting: Oncology

## 2019-12-07 DIAGNOSIS — Z87891 Personal history of nicotine dependence: Secondary | ICD-10-CM

## 2019-12-07 DIAGNOSIS — Z122 Encounter for screening for malignant neoplasm of respiratory organs: Secondary | ICD-10-CM | POA: Diagnosis present

## 2019-12-07 NOTE — Progress Notes (Signed)
Virtual Visit via Video Note  I connected with Lynn Small on 12/07/19 at 10:15 AM EDT by a video enabled telemedicine application and verified that I am speaking with the correct person using two identifiers.  Location: Patient: [facility] Provider: Clinic   I discussed the limitations of evaluation and management by telemedicine and the availability of in person appointments. The patient expressed understanding and agreed to proceed.  I discussed the assessment and treatment plan with the patient. The patient was provided an opportunity to ask questions and all were answered. The patient agreed with the plan and demonstrated an understanding of the instructions.   The patient was advised to call back or seek an in-person evaluation if the symptoms worsen or if the condition fails to improve as anticipated.   In accordance with CMS guidelines, patient has met eligibility criteria including age, absence of signs or symptoms of lung cancer.  Social History   Tobacco Use  . Smoking status: Former Smoker    Packs/day: 1.00    Years: 30.00    Pack years: 30.00    Types: Cigarettes    Quit date: 10/14/2016    Years since quitting: 3.1  . Smokeless tobacco: Never Used  Vaping Use  . Vaping Use: Never used  Substance Use Topics  . Alcohol use: Yes    Alcohol/week: 14.0 standard drinks    Types: 14 Glasses of wine per week  . Drug use: Not on file      A shared decision-making session was conducted prior to the performance of CT scan. This includes one or more decision aids, includes benefits and harms of screening, follow-up diagnostic testing, over-diagnosis, false positive rate, and total radiation exposure.   Counseling on the importance of adherence to annual lung cancer LDCT screening, impact of co-morbidities, and ability or willingness to undergo diagnosis and treatment is imperative for compliance of the program.   Counseling on the importance of continued smoking cessation for  former smokers; the importance of smoking cessation for current smokers, and information about tobacco cessation interventions have been given to patient including [place] and [facility] quit [place] programs.   Written order for lung cancer screening with LDCT has been given to the patient and any and all questions have been answered to the best of my abilities.    Yearly follow up will be coordinated by Burgess Estelle, Thoracic Navigator.  I provided 15 minutes of face-to-face video visit time during this encounter, and > 50% was spent counseling as documented under my assessment & plan.   Jacquelin Hawking, NP

## 2019-12-11 ENCOUNTER — Encounter: Payer: Self-pay | Admitting: *Deleted

## 2020-02-15 ENCOUNTER — Other Ambulatory Visit: Payer: Self-pay

## 2020-02-15 ENCOUNTER — Ambulatory Visit
Admission: RE | Admit: 2020-02-15 | Discharge: 2020-02-15 | Disposition: A | Discharge status: Home / Self Care | Payer: BC Managed Care – PPO | Source: Ambulatory Visit | Attending: Internal Medicine | Admitting: Internal Medicine

## 2020-02-15 DIAGNOSIS — Z1231 Encounter for screening mammogram for malignant neoplasm of breast: Secondary | ICD-10-CM | POA: Diagnosis not present

## 2020-05-09 ENCOUNTER — Other Ambulatory Visit: Payer: Self-pay | Admitting: Internal Medicine

## 2020-05-09 DIAGNOSIS — M25562 Pain in left knee: Secondary | ICD-10-CM

## 2020-05-14 ENCOUNTER — Other Ambulatory Visit: Payer: Self-pay

## 2020-05-14 ENCOUNTER — Ambulatory Visit
Admission: RE | Admit: 2020-05-14 | Discharge: 2020-05-14 | Disposition: A | Discharge status: Home / Self Care | Payer: BC Managed Care – PPO | Source: Ambulatory Visit | Attending: Internal Medicine | Admitting: Internal Medicine

## 2020-05-14 DIAGNOSIS — M25562 Pain in left knee: Secondary | ICD-10-CM | POA: Diagnosis present

## 2020-11-28 ENCOUNTER — Other Ambulatory Visit: Payer: Self-pay | Admitting: Internal Medicine

## 2020-11-28 DIAGNOSIS — Z1231 Encounter for screening mammogram for malignant neoplasm of breast: Secondary | ICD-10-CM

## 2020-12-19 ENCOUNTER — Other Ambulatory Visit: Payer: Self-pay | Admitting: *Deleted

## 2020-12-19 DIAGNOSIS — Z87891 Personal history of nicotine dependence: Secondary | ICD-10-CM

## 2021-01-16 ENCOUNTER — Ambulatory Visit
Admission: RE | Admit: 2021-01-16 | Discharge: 2021-01-16 | Disposition: A | Discharge status: Home / Self Care | Payer: BC Managed Care – PPO | Source: Ambulatory Visit | Attending: Acute Care | Admitting: Acute Care

## 2021-01-16 ENCOUNTER — Other Ambulatory Visit: Payer: Self-pay

## 2021-01-16 DIAGNOSIS — Z87891 Personal history of nicotine dependence: Secondary | ICD-10-CM | POA: Diagnosis not present

## 2021-01-23 ENCOUNTER — Other Ambulatory Visit: Payer: Self-pay | Admitting: Acute Care

## 2021-01-23 DIAGNOSIS — Z87891 Personal history of nicotine dependence: Secondary | ICD-10-CM

## 2021-02-19 ENCOUNTER — Other Ambulatory Visit: Payer: Self-pay

## 2021-02-19 ENCOUNTER — Ambulatory Visit
Admission: RE | Admit: 2021-02-19 | Discharge: 2021-02-19 | Disposition: A | Discharge status: Home / Self Care | Payer: BC Managed Care – PPO | Source: Ambulatory Visit | Attending: Internal Medicine | Admitting: Internal Medicine

## 2021-02-19 DIAGNOSIS — Z1231 Encounter for screening mammogram for malignant neoplasm of breast: Secondary | ICD-10-CM | POA: Insufficient documentation

## 2021-06-04 ENCOUNTER — Encounter: Payer: Self-pay | Admitting: Nurse Practitioner

## 2021-06-04 ENCOUNTER — Ambulatory Visit: Payer: Self-pay | Admitting: Nurse Practitioner

## 2021-06-04 VITALS — BP 112/72 | HR 81 | Temp 97.5°F | Resp 16

## 2021-06-04 DIAGNOSIS — Z20822 Contact with and (suspected) exposure to covid-19: Secondary | ICD-10-CM

## 2021-06-04 LAB — POC COVID19 BINAXNOW: SARS Coronavirus 2 Ag: POSITIVE — AB

## 2021-06-04 NOTE — Progress Notes (Signed)
? ?  Subjective:  ? ? Patient ID: Lynn Small, female    DOB: 1965/09/17, 56 y.o.   MRN: [id number] ? ?HPI ? ?56 year old female presenting to [facility] with complaints of cough & congestion with a sore throat and body aches-started feeling sick 3 days ago. She has been at work the past two days.  ? ?She took a home test that was positive but the test was expired so she would like to be retested today.  ? ?Denies a history to HTN, DM, or asthma.  ? ?She had COVID 55/9741 no complications ?She has been vaccinated for COVID  ? ? ?Today's Vitals  ? 06/04/21 1050  ?BP: 112/72  ?Pulse: 81  ?Resp: 16  ?Temp: (!) 97.5 ?F (36.4 ?C)  ?TempSrc: Tympanic  ?SpO2: 98%  ? ?There is no height or weight on file to calculate BMI.  ? ?Review of Systems  ?Constitutional: Negative.   ?HENT:  Positive for congestion.   ?Eyes: Negative.   ?Respiratory:  Positive for cough.   ?Cardiovascular: Negative.   ?Genitourinary: Negative.   ?Musculoskeletal:  Positive for myalgias.  ?Skin: Negative.   ?Neurological: Negative.   ? ?   ?Objective:  ? Physical Exam ?HENT:  ?   Head: Normocephalic.  ?   Right Ear: Tympanic membrane, ear canal and external ear normal.  ?   Left Ear: Tympanic membrane, ear canal and external ear normal.  ?   Nose: Congestion present.  ?   Mouth/Throat:  ?   Mouth: Mucous membranes are moist.  ?Eyes:  ?   Pupils: Pupils are equal, round, and reactive to light.  ?Cardiovascular:  ?   Rate and Rhythm: Normal rate and regular rhythm.  ?   Heart sounds: Normal heart sounds.  ?Pulmonary:  ?   Effort: Pulmonary effort is normal.  ?   Breath sounds: Normal breath sounds.  ?Musculoskeletal:  ?   Cervical back: Normal range of motion.  ?Skin: ?   General: Skin is warm.  ?   Capillary Refill: Capillary refill takes less than 2 seconds.  ?Neurological:  ?   General: No focal deficit present.  ?   Mental Status: She is alert.  ?Psychiatric:     ?   Mood and Affect: Mood normal.  ? ? ? ?Recent Results (from the past  2160 hour(s))  ?POC COVID-19     Status: Abnormal  ? Collection Time: 06/04/21 11:02 AM  ?Result Value Ref Range  ? SARS Coronavirus 2 Ag Positive (A) Negative  ?  ? ?   ?Assessment & Plan:  ?1. Encounter for screening laboratory testing for COVID-19 virus ? ?- POC COVID-19 positive  ? ?Advised OTC management of symptoms Mucinex for cough/congestion  ?Push fluids  ?Rest  ?Vitamin C for immune support  ? ?Discussed anti-viral medications, patient will contact if interested  ?Will otherwise RTC as needed if symptoms persist or with new concerns  ? ?RTC 06/10/21 mask through 06/11/21 ?   ? ?

## 2022-01-06 ENCOUNTER — Other Ambulatory Visit: Payer: Self-pay | Admitting: Internal Medicine

## 2022-01-06 DIAGNOSIS — Z1231 Encounter for screening mammogram for malignant neoplasm of breast: Secondary | ICD-10-CM

## 2022-01-13 ENCOUNTER — Encounter (INDEPENDENT_AMBULATORY_CARE_PROVIDER_SITE_OTHER): Payer: Self-pay

## 2022-01-16 ENCOUNTER — Ambulatory Visit: Payer: BC Managed Care – PPO

## 2022-01-20 ENCOUNTER — Ambulatory Visit
Admission: RE | Admit: 2022-01-20 | Discharge: 2022-01-20 | Disposition: A | Discharge status: Home / Self Care | Payer: BC Managed Care – PPO | Source: Ambulatory Visit | Attending: Acute Care | Admitting: Acute Care

## 2022-01-20 DIAGNOSIS — Z87891 Personal history of nicotine dependence: Secondary | ICD-10-CM | POA: Insufficient documentation

## 2022-01-22 ENCOUNTER — Other Ambulatory Visit: Payer: Self-pay

## 2022-01-22 DIAGNOSIS — Z122 Encounter for screening for malignant neoplasm of respiratory organs: Secondary | ICD-10-CM

## 2022-01-22 DIAGNOSIS — Z87891 Personal history of nicotine dependence: Secondary | ICD-10-CM

## 2022-02-20 ENCOUNTER — Ambulatory Visit
Admission: RE | Admit: 2022-02-20 | Discharge: 2022-02-20 | Disposition: A | Discharge status: Home / Self Care | Payer: BC Managed Care – PPO | Source: Ambulatory Visit | Attending: Internal Medicine | Admitting: Internal Medicine

## 2022-02-20 DIAGNOSIS — Z1231 Encounter for screening mammogram for malignant neoplasm of breast: Secondary | ICD-10-CM | POA: Diagnosis present

## 2023-01-01 ENCOUNTER — Other Ambulatory Visit: Payer: Self-pay

## 2023-01-01 ENCOUNTER — Encounter: Payer: Self-pay | Admitting: Adult Health

## 2023-01-01 ENCOUNTER — Ambulatory Visit (INDEPENDENT_AMBULATORY_CARE_PROVIDER_SITE_OTHER): Payer: Self-pay | Admitting: Adult Health

## 2023-01-01 VITALS — BP 150/90 | HR 91 | Temp 98.0°F | Ht 67.0 in | Wt 220.0 lb

## 2023-01-01 DIAGNOSIS — B349 Viral infection, unspecified: Secondary | ICD-10-CM

## 2023-01-01 DIAGNOSIS — R051 Acute cough: Secondary | ICD-10-CM

## 2023-01-01 DIAGNOSIS — R52 Pain, unspecified: Secondary | ICD-10-CM

## 2023-01-01 LAB — POC SOFIA 2 FLU + SARS ANTIGEN FIA
Influenza A, POC: NEGATIVE
Influenza B, POC: NEGATIVE
SARS Coronavirus 2 Ag: NEGATIVE

## 2023-01-01 MED ORDER — ALBUTEROL SULFATE HFA 108 (90 BASE) MCG/ACT IN AERS: 2.0000 | INHALATION_SPRAY | Freq: Four times a day (QID) | RESPIRATORY_TRACT | 0 refills | Status: DC | PRN

## 2023-01-01 NOTE — Progress Notes (Signed)
[facility] [address] [place], [place] [address]   Office Visit Note  Patient Name: Lynn Small Date of Birth [id number]  Medical Record number [id number]  Date of Service: 01/01/2023  Chief Complaint  Patient presents with   Acute Visit    Patient c/o sore throat, BL ear pain, headache, dry cough with yellow mucous production at night, and stiffness in her neck. Symptoms began about 10 days ago since moving into a new home where the previous owner had cats. She states she has an allergy to cat dander. No known sick contacts. She has tried Nyquil, Delsym, and ibuprofen but ha not seen any improvement in symptoms.      HPI Pt is here for a sick visit. She reports she has been having symptoms for about two weeks. She recently moved into a new rental house.  She believes the person before her had a cat, and she has a history of allergies.  Yesterday she started having sore throat, ear pain, cough and headache. She has been taking Delsym and ibuprofen without relief.    Current Medication:  Outpatient Encounter Medications as of 01/01/2023  Medication Sig   CALCIUM CARB-CHOLECALCIFEROL PO Take by mouth daily.   Cholecalciferol (VITAMIN D PO) Take by mouth daily.   cyanocobalamin 1000 MCG tablet Take 1,000 mcg by mouth daily.   FEXOFENADINE HCL PO Take 1 tablet by mouth daily.   fluticasone (FLONASE) 50 MCG/ACT nasal spray Place 1 spray into both nostrils daily.   GLUCOSAMINE-CHONDROITIN PO Take by mouth. Takes occasionally   MILK THISTLE PO Take by mouth. Takes occasionally   Multiple Vitamin (MULTIVITAMIN) tablet Take 1 tablet by mouth daily.   NON FORMULARY daily. Tru Niagen   Omega-3 Fatty Acids (FISH OIL PO) Take by mouth daily.   RABEprazole (ACIPHEX) 20 MG tablet Take 20 mg by mouth 2 (two) times daily.   sertraline (ZOLOFT) 50 MG tablet Take 50 mg by mouth daily.   TURMERIC PO Take by mouth. Takes occasionally   [DISCONTINUED] aspirin 81 MG EC tablet Take 1  tablet by mouth daily.   [DISCONTINUED] B Complex Vitamins (B COMPLEX PO) Take by mouth daily.   [DISCONTINUED] doxycycline (VIBRA-TABS) 100 MG tablet Take 1 tablet (100 mg total) by mouth 2 (two) times daily.   [DISCONTINUED] Probiotic Product (PROBIOTIC PO) Take by mouth daily.   [DISCONTINUED] rosuvastatin (CRESTOR) 5 MG tablet Take 5 mg by mouth daily.   No facility-administered encounter medications on file as of 01/01/2023.      Medical History: Past Medical History:  Diagnosis Date   GERD (gastroesophageal reflux disease)    Hypertension    Wears contact lenses      Vital Signs: BP (!) 150/90   Pulse 91   Temp 98 F (36.7 C)   Ht 5\' 7"  (1.702 m)   Wt 220 lb (99.8 kg)   SpO2 99%   BMI 34.46 kg/m    Review of Systems  Constitutional:  Negative for chills, fatigue and fever.  Eyes:  Negative for pain and itching.  Respiratory:  Positive for cough.   Cardiovascular:  Negative for chest pain.  Gastrointestinal:  Negative for diarrhea, nausea and vomiting.    Physical Exam Vitals and nursing note reviewed.  Constitutional:      Appearance: Normal appearance.  HENT:     Head: Normocephalic.     Right Ear: Tympanic membrane and ear canal normal.     Left Ear: Tympanic membrane and ear  canal normal.     Nose: Nose normal.     Mouth/Throat:     Mouth: Mucous membranes are moist.     Pharynx: Posterior oropharyngeal erythema present.  Eyes:     Pupils: Pupils are equal, round, and reactive to light.  Pulmonary:     Effort: Pulmonary effort is normal.  Lymphadenopathy:     Cervical: No cervical adenopathy.  Neurological:     Mental Status: She is alert.    Assessment/Plan: 1. Viral illness Rest, and drink plenty of water.  Use cough drops, gargle warm sal water or drink wam liquids (like tea with honey) as needed for cough/throat irritation.   Take over-the-counter medicines (such as Dayquil or Nyquil) as discussed at your visit to help manage your  symptoms.  Send a MyChart message to the provider or schedule a return appointment as needed for new/worsening symptoms (especially shortness of breath or chest pain) or if symptoms not improving with recommended treatment over the next 5-7 days.      2. Acute cough Use albuterol as discussed. Follow up via MyChart messenger if symptoms fail to improve or may return to clinic as needed for worsening symptoms.   - albuterol (VENTOLIN HFA) 108 (90 Base) MCG/ACT inhaler; Inhale 2 puffs into the lungs every 6 (six) hours as needed for wheezing or shortness of breath.  Dispense: 8 g; Refill: 0  3. Body aches - POC SOFIA 2 FLU + SARS ANTIGEN FIA     General Counseling: Lynn Small verbalizes understanding of the findings of todays visit and agrees with plan of treatment. I have discussed any further diagnostic evaluation that may be needed or ordered today. We also reviewed her medications today. she has been encouraged to call the office with any questions or concerns that should arise related to todays visit.   Orders Placed This Encounter  Procedures   POC SOFIA 2 FLU + SARS ANTIGEN FIA    No orders of the defined types were placed in this encounter.   Time spent:20 Minutes    Johnna Acosta AGNP-C Nurse Practitioner

## 2023-01-07 ENCOUNTER — Other Ambulatory Visit: Payer: Self-pay | Admitting: Internal Medicine

## 2023-01-07 DIAGNOSIS — Z1231 Encounter for screening mammogram for malignant neoplasm of breast: Secondary | ICD-10-CM

## 2023-01-21 ENCOUNTER — Ambulatory Visit
Admission: RE | Admit: 2023-01-21 | Discharge: 2023-01-21 | Disposition: A | Discharge status: Home / Self Care | Payer: BC Managed Care – PPO | Source: Ambulatory Visit | Attending: Acute Care | Admitting: Acute Care

## 2023-01-21 DIAGNOSIS — Z87891 Personal history of nicotine dependence: Secondary | ICD-10-CM | POA: Diagnosis present

## 2023-01-21 DIAGNOSIS — Z122 Encounter for screening for malignant neoplasm of respiratory organs: Secondary | ICD-10-CM | POA: Insufficient documentation

## 2023-01-22 ENCOUNTER — Ambulatory Visit: Payer: BC Managed Care – PPO

## 2023-02-17 ENCOUNTER — Other Ambulatory Visit: Payer: Self-pay

## 2023-02-17 DIAGNOSIS — Z122 Encounter for screening for malignant neoplasm of respiratory organs: Secondary | ICD-10-CM

## 2023-02-17 DIAGNOSIS — Z87891 Personal history of nicotine dependence: Secondary | ICD-10-CM

## 2023-02-23 ENCOUNTER — Ambulatory Visit
Admission: RE | Admit: 2023-02-23 | Discharge: 2023-02-23 | Disposition: A | Discharge status: Home / Self Care | Payer: BC Managed Care – PPO | Source: Ambulatory Visit | Attending: Internal Medicine | Admitting: Internal Medicine

## 2023-02-23 DIAGNOSIS — Z1231 Encounter for screening mammogram for malignant neoplasm of breast: Secondary | ICD-10-CM | POA: Diagnosis present

## 2023-05-14 ENCOUNTER — Ambulatory Visit: Payer: Self-pay | Admitting: Adult Health

## 2023-06-02 ENCOUNTER — Encounter: Payer: Self-pay | Admitting: Oncology

## 2023-06-02 ENCOUNTER — Other Ambulatory Visit: Payer: Self-pay

## 2023-06-02 ENCOUNTER — Ambulatory Visit (INDEPENDENT_AMBULATORY_CARE_PROVIDER_SITE_OTHER): Payer: Self-pay | Admitting: Oncology

## 2023-06-02 VITALS — BP 161/89 | HR 82 | Temp 96.6°F | Ht 67.0 in

## 2023-06-02 DIAGNOSIS — J029 Acute pharyngitis, unspecified: Secondary | ICD-10-CM

## 2023-06-02 LAB — POCT RAPID STREP A (OFFICE): Rapid Strep A Screen: NEGATIVE

## 2023-06-02 LAB — POC SOFIA 2 FLU + SARS ANTIGEN FIA
Influenza A, POC: NEGATIVE
Influenza B, POC: NEGATIVE
SARS Coronavirus 2 Ag: NEGATIVE

## 2023-06-02 MED ORDER — PREDNISONE 20 MG PO TABS: 20.0000 mg | ORAL_TABLET | Freq: Every day | ORAL | 0 refills | Status: AC

## 2023-06-02 MED ORDER — AMOXICILLIN-POT CLAVULANATE 875-125 MG PO TABS: 1.0000 | ORAL_TABLET | Freq: Two times a day (BID) | ORAL | 0 refills | Status: AC

## 2023-06-02 NOTE — Progress Notes (Signed)
 [facility]  [address] [place], [place] [address] Phone: [phone] Fax: [phone]   Office Visit Note  Patient Name: Lynn Small  Date of ZDGLO:[id number]  Med Rec number [id number]  Date of Service: 06/02/2023  Patient has no known allergies.  Chief Complaint  Patient presents with   Sore Throat    Patient c/o sore throat and headache. Symptoms began yesterday and have worsened over night. She took 800 mg of ibuprofen about 2 hours ago which has been somewhat helpful.     HPI Patient is an 58 y.o. female with St that started last night. Feels very tired and neck pain. Mild HA. Has taken 800 mg Ibuprofen this morning. No sick contacts. No fevers. Hadtrouble swallowing water this morning. Mild dizziness yesterday when she turned her head. No N/V. Has had strep in the past and has tested negatiev for rapid but positive for cutlure.   NKDA. No receant antibiotics.   Current Medication:  Outpatient Encounter Medications as of 06/02/2023  Medication Sig   amoxicillin-clavulanate (AUGMENTIN) 875-125 MG tablet Take 1 tablet by mouth 2 (two) times daily.   CALCIUM CARB-CHOLECALCIFEROL PO Take by mouth daily.   Cholecalciferol (VITAMIN D PO) Take by mouth daily.   cyanocobalamin 1000 MCG tablet Take 1,000 mcg by mouth daily.   FEXOFENADINE HCL PO Take 1 tablet by mouth daily.   fluticasone (FLONASE) 50 MCG/ACT nasal spray Place 1 spray into both nostrils daily.   GLUCOSAMINE-CHONDROITIN PO Take by mouth. Takes occasionally   MILK THISTLE PO Take by mouth. Takes occasionally   Multiple Vitamin (MULTIVITAMIN) tablet Take 1 tablet by mouth daily.   NON FORMULARY daily. Tru Niagen   Omega-3 Fatty Acids (FISH OIL PO) Take by mouth daily.   phentermine (ADIPEX-P) 37.5 MG tablet Take 37.5 mg by mouth every morning.   predniSONE (DELTASONE) 20 MG tablet Take 1 tablet (20 mg total) by mouth daily with breakfast.   RABEprazole (ACIPHEX) 20 MG tablet Take 20 mg by mouth 2  (two) times daily.   sertraline (ZOLOFT) 50 MG tablet Take 50 mg by mouth daily.   traZODone (DESYREL) 50 MG tablet Take 50 mg by mouth at bedtime as needed.   [DISCONTINUED] albuterol (VENTOLIN HFA) 108 (90 Base) MCG/ACT inhaler Inhale 2 puffs into the lungs every 6 (six) hours as needed for wheezing or shortness of breath.   [DISCONTINUED] TURMERIC PO Take by mouth. Takes occasionally   No facility-administered encounter medications on file as of 06/02/2023.      Medical History: Past Medical History:  Diagnosis Date   GERD (gastroesophageal reflux disease)    Hypertension    Wears contact lenses      Vital Signs: BP (!) 161/89   Pulse 82   Temp (!) 96.6 F (35.9 C)   Ht 5\' 7"  (1.702 m)   SpO2 98%   BMI 33.67 kg/m   ROS: As per HPI.  All other pertinent ROS negative.     Review of Systems  Constitutional:  Positive for fatigue.  HENT:  Positive for postnasal drip, sore throat and trouble swallowing. Negative for congestion, ear pain, sinus pressure and sinus pain.   Respiratory:  Negative for cough.   Gastrointestinal:  Negative for diarrhea, nausea and vomiting.  Musculoskeletal:  Positive for myalgias and neck pain.  Neurological:  Positive for dizziness and headaches.    Physical Exam Vitals reviewed.  Constitutional:      Appearance: She is well-developed. She is obese.  HENT:     Right Ear: A middle ear effusion is present.     Left Ear: A middle ear effusion is present.     Nose: Congestion present.     Right Turbinates: Not swollen.     Left Turbinates: Not swollen.     Right Sinus: No maxillary sinus tenderness or frontal sinus tenderness.     Left Sinus: No maxillary sinus tenderness or frontal sinus tenderness.     Mouth/Throat:     Mouth: Mucous membranes are moist.     Pharynx: Postnasal drip present. No posterior oropharyngeal erythema.     Tonsils: Tonsillar exudate present. 1+ on the right. 1+ on the left.  Pulmonary:     Effort: Pulmonary  effort is normal.     Breath sounds: Normal breath sounds.  Lymphadenopathy:     Head:     Right side of head: Submandibular and tonsillar adenopathy present.     Left side of head: Submandibular and tonsillar adenopathy present.     Cervical: Cervical adenopathy present.  Neurological:     Mental Status: She is alert.     Results for orders placed or performed in visit on 06/02/23 (from the past 24 hours)  POCT rapid strep A     Status: None   Collection Time: 06/02/23 11:00 AM  Result Value Ref Range   Rapid Strep A Screen Negative Negative  POC SOFIA 2 FLU + SARS ANTIGEN FIA     Status: None   Collection Time: 06/02/23 11:00 AM  Result Value Ref Range   Influenza A, POC Negative Negative   Influenza B, POC Negative Negative   SARS Coronavirus 2 Ag Negative Negative    Assessment/Plan: 1. Sore throat (Primary) Strep, COVID and flu negative.  Strep culture sent to [facility].  Will let her know of results.  Has had negative rapid strep with positive strep cultures in the past.   Finish all antibiotics as prescribed, even when you are feeling better.  TAke antibiotic with food.  Take an over-the-counter pain reliever/anti-inflammatory (such as ibuprofen) to help relieve pain or fever.  Rest and drink plenty of water.  Drinking warm or cold liquids (such as tea, soup or smoothies) and eating soft foods (such as oatmeal) may be more comfortable until your throat pain improves.  Do not share cups/water bottles/ utensils with others.  Wash your hands or use hand sanitizer often, especially before/after eating and after coughing into your hand or blowing your nose.  Send secure message to provider or schedule return appointment as needed for new/worsening symptoms (such as increased throat pain) or if your symptoms are not improving after 2-3 days on the antibiotic.     - POCT rapid strep A - POC SOFIA 2 FLU + SARS ANTIGEN FIA - amoxicillin-clavulanate (AUGMENTIN) 875-125 MG tablet; Take 1  tablet by mouth 2 (two) times daily.  Dispense: 14 tablet; Refill: 0 - predniSONE (DELTASONE) 20 MG tablet; Take 1 tablet (20 mg total) by mouth daily with breakfast.  Dispense: 10 tablet; Refill: 0 - Culture, Group A Strep   General Counseling: Clarabelle verbalizes understanding of the findings of todays visit and agrees with plan of treatment. I have discussed any further diagnostic evaluation that may be needed or ordered today. We also reviewed her medications today. she has been encouraged to call the office with any questions or concerns that should arise related to todays visit.   Orders Placed This Encounter  Procedures   Culture, Group  A Strep   POCT rapid strep A   POC SOFIA 2 FLU + SARS ANTIGEN FIA    Meds ordered this encounter  Medications   amoxicillin-clavulanate (AUGMENTIN) 875-125 MG tablet    Sig: Take 1 tablet by mouth 2 (two) times daily.    Dispense:  14 tablet    Refill:  0   predniSONE (DELTASONE) 20 MG tablet    Sig: Take 1 tablet (20 mg total) by mouth daily with breakfast.    Dispense:  10 tablet    Refill:  0    I spent 20 minutes dedicated to the care of this patient (face-to-face and non-face-to-face) on the date of the encounter to include what is described in the assessment and plan.   Durenda Hurt, NP 06/02/2023 11:05 AM

## 2023-06-05 ENCOUNTER — Encounter: Payer: Self-pay | Admitting: Oncology

## 2023-06-05 LAB — CULTURE, GROUP A STREP: Strep A Culture: NEGATIVE

## 2023-11-02 IMAGING — MG MM DIGITAL SCREENING BILAT W/ TOMO AND CAD
8 series · 8 of 24 positions shown · non-contrast
Comparison: Previous exam(s).

ACR Breast Density Category a: The breast tissue is almost entirely
fatty.

CLINICAL DATA: Screening.

EXAM:
DIGITAL SCREENING BILATERAL MAMMOGRAM WITH TOMOSYNTHESIS AND CAD
TECHNIQUE: Bilateral screening digital craniocaudal and mediolateral oblique
mammograms were obtained. Bilateral screening digital breast
tomosynthesis was performed. The images were evaluated with
computer-aided detection.

[R CC synth-2D]
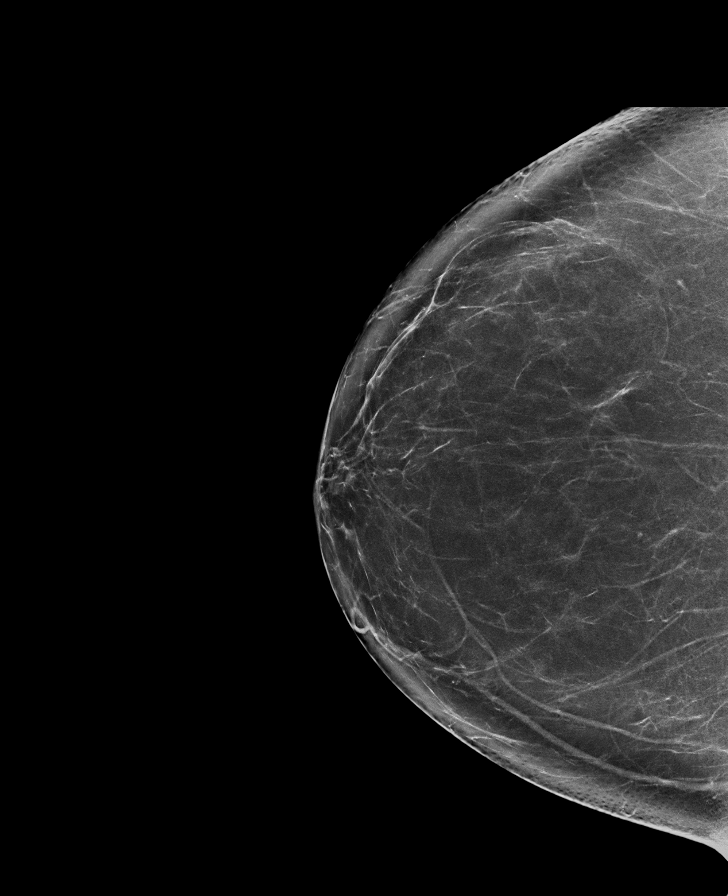

[R MLO synth-2D]
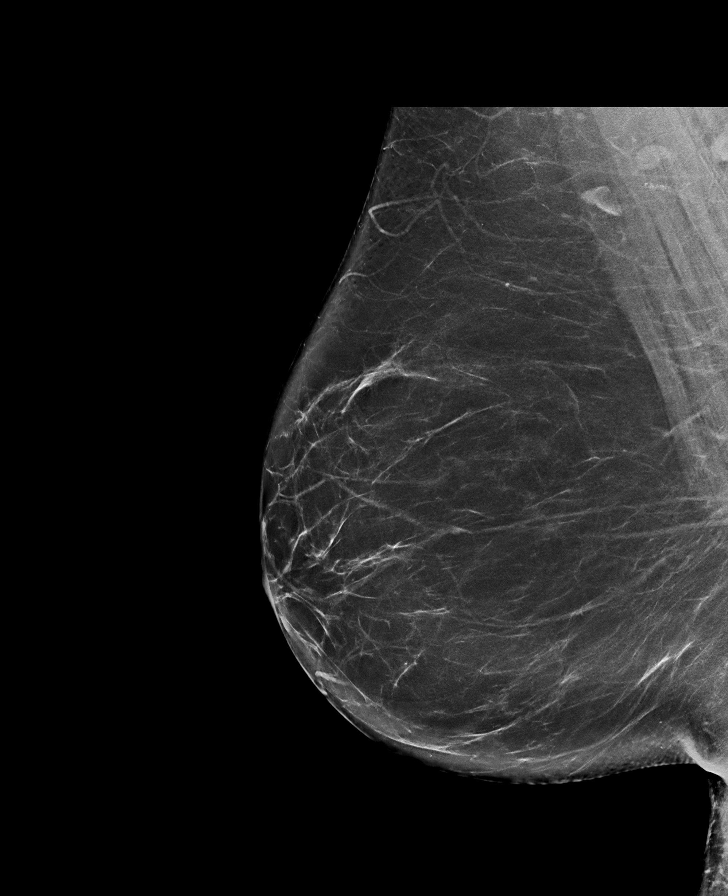

[L CC synth-2D]
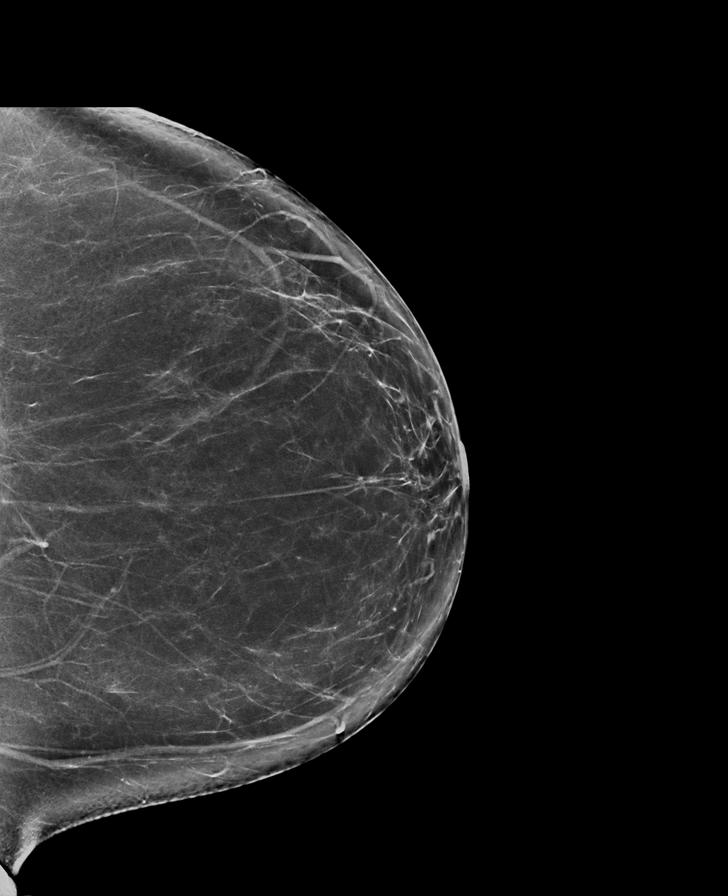

[L MLO synth-2D]
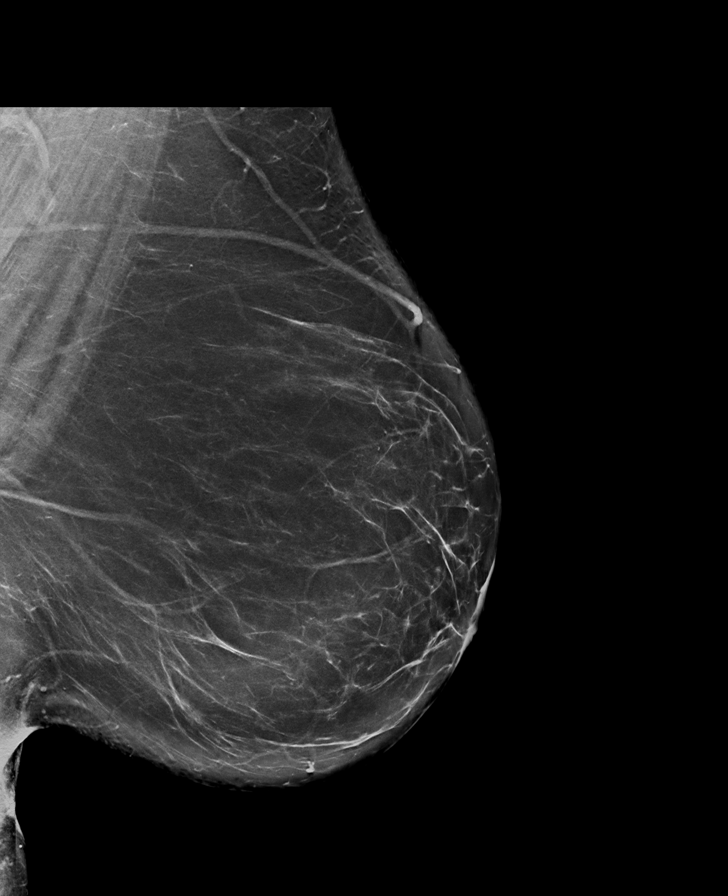

[L MLO tomo · tomo slice 49/97.0]
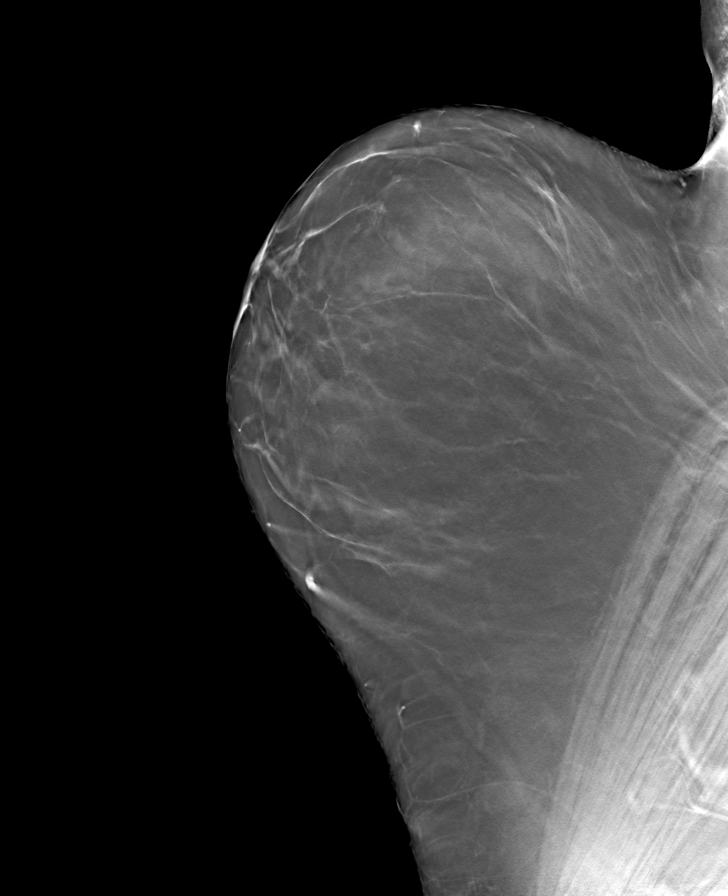

[R CC tomo · tomo slice 42/83.0]
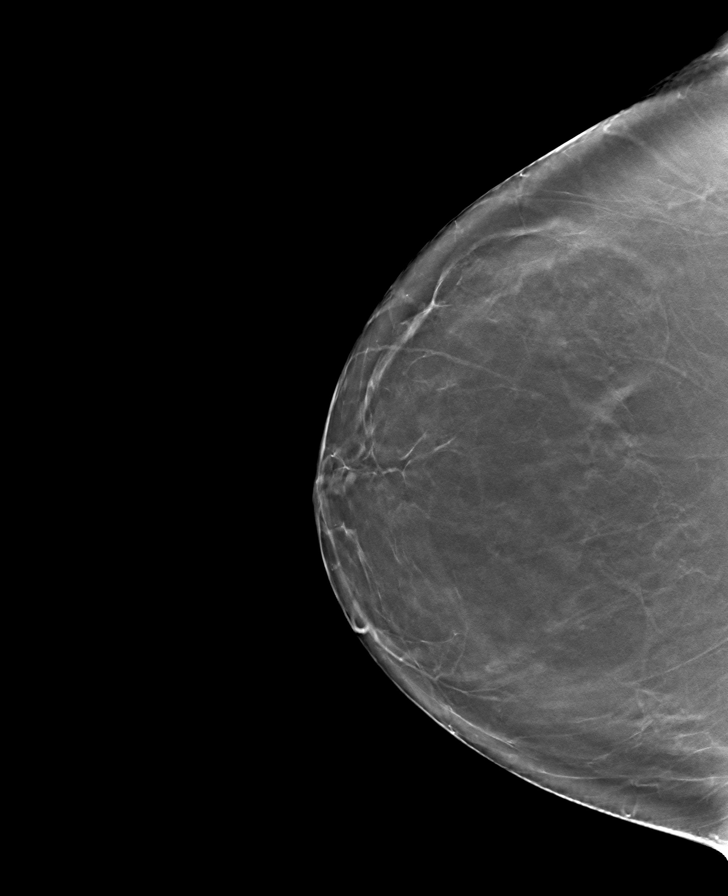

[R MLO tomo · tomo slice 46/91.0]
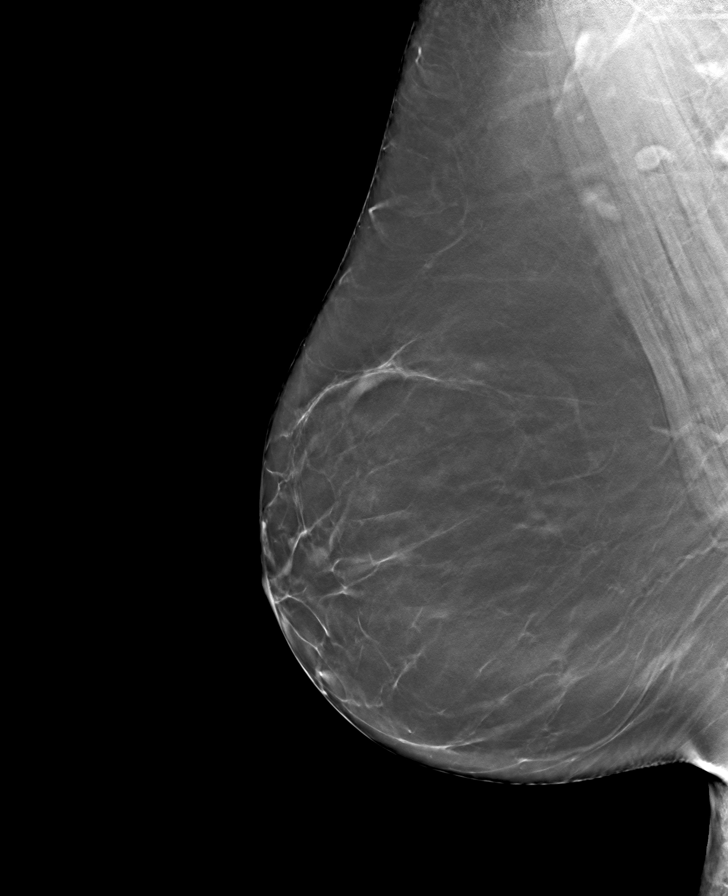

[L CC tomo · tomo slice 42/83.0]
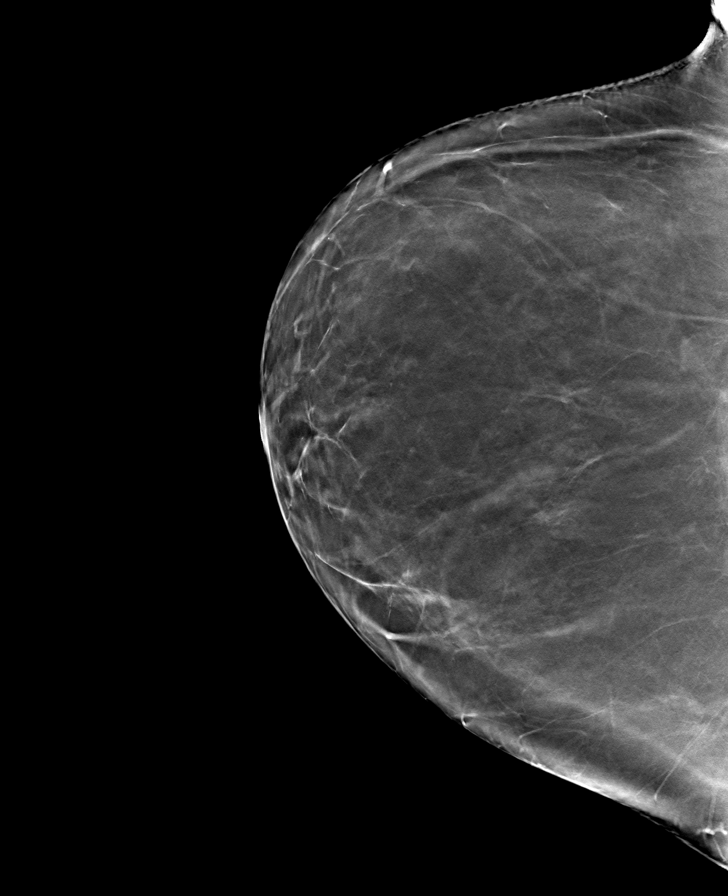

[8 of 24 positions shown; findings below may reference images not displayed]

FINDINGS: There are no findings suspicious for malignancy.
IMPRESSION: No mammographic evidence of malignancy. A result letter of this
screening mammogram will be mailed directly to the patient.

RECOMMENDATION:
Screening mammogram in one year. (Code:0E-3-N98)

BI-RADS CATEGORY  1: Negative.

## 2024-01-25 ENCOUNTER — Ambulatory Visit

## 2024-01-27 ENCOUNTER — Ambulatory Visit
Admission: RE | Admit: 2024-01-27 | Discharge: 2024-01-27 | Disposition: A | Discharge status: Home / Self Care | Source: Ambulatory Visit | Attending: Acute Care | Admitting: Acute Care

## 2024-01-27 DIAGNOSIS — Z87891 Personal history of nicotine dependence: Secondary | ICD-10-CM | POA: Diagnosis present

## 2024-01-27 DIAGNOSIS — Z122 Encounter for screening for malignant neoplasm of respiratory organs: Secondary | ICD-10-CM | POA: Diagnosis present

## 2024-02-05 ENCOUNTER — Other Ambulatory Visit: Payer: Self-pay

## 2024-02-05 DIAGNOSIS — Z87891 Personal history of nicotine dependence: Secondary | ICD-10-CM

## 2024-02-05 DIAGNOSIS — Z122 Encounter for screening for malignant neoplasm of respiratory organs: Secondary | ICD-10-CM

## 2024-02-19 ENCOUNTER — Other Ambulatory Visit: Payer: Self-pay | Admitting: Internal Medicine

## 2024-02-19 DIAGNOSIS — Z1231 Encounter for screening mammogram for malignant neoplasm of breast: Secondary | ICD-10-CM

## 2024-02-22 ENCOUNTER — Other Ambulatory Visit: Payer: Self-pay | Admitting: Medical Genetics

## 2024-02-29 ENCOUNTER — Ambulatory Visit
Admission: RE | Admit: 2024-02-29 | Discharge: 2024-02-29 | Disposition: A | Source: Ambulatory Visit | Attending: Internal Medicine | Admitting: Internal Medicine

## 2024-02-29 DIAGNOSIS — Z1231 Encounter for screening mammogram for malignant neoplasm of breast: Secondary | ICD-10-CM | POA: Diagnosis present
